# Patient Record
Sex: Female | Born: 1950 | Race: White | Hispanic: No | Marital: Married | Smoking: Former smoker
Health system: Southern US, Community
[De-identification: ages and names within clinical notes are randomized; demographics above are authoritative.]

## PROBLEM LIST (undated history)

## (undated) DIAGNOSIS — F419 Anxiety disorder, unspecified: Secondary | ICD-10-CM

## (undated) DIAGNOSIS — F32A Depression, unspecified: Secondary | ICD-10-CM

## (undated) DIAGNOSIS — E079 Disorder of thyroid, unspecified: Secondary | ICD-10-CM

## (undated) DIAGNOSIS — M1990 Unspecified osteoarthritis, unspecified site: Secondary | ICD-10-CM

## (undated) DIAGNOSIS — T7840XA Allergy, unspecified, initial encounter: Secondary | ICD-10-CM

## (undated) DIAGNOSIS — D649 Anemia, unspecified: Secondary | ICD-10-CM

## (undated) DIAGNOSIS — I10 Essential (primary) hypertension: Secondary | ICD-10-CM

## (undated) HISTORY — DX: Anemia, unspecified: D64.9

## (undated) HISTORY — DX: Allergy, unspecified, initial encounter: T78.40XA

## (undated) HISTORY — PX: APPENDECTOMY: SHX54

## (undated) HISTORY — DX: Anxiety disorder, unspecified: F41.9

## (undated) HISTORY — PX: LUMBAR LAMINECTOMY: SHX95

## (undated) HISTORY — DX: Disorder of thyroid, unspecified: E07.9

## (undated) HISTORY — DX: Unspecified osteoarthritis, unspecified site: M19.90

## (undated) HISTORY — DX: Essential (primary) hypertension: I10

## (undated) HISTORY — PX: ABDOMINAL HYSTERECTOMY: SHX81

## (undated) HISTORY — DX: Depression, unspecified: F32.A

---

## 2014-01-03 DIAGNOSIS — M2010 Hallux valgus (acquired), unspecified foot: Secondary | ICD-10-CM | POA: Insufficient documentation

## 2014-03-18 DIAGNOSIS — Z4889 Encounter for other specified surgical aftercare: Secondary | ICD-10-CM | POA: Insufficient documentation

## 2014-04-20 DIAGNOSIS — M5414 Radiculopathy, thoracic region: Secondary | ICD-10-CM | POA: Insufficient documentation

## 2015-02-15 DIAGNOSIS — M5127 Other intervertebral disc displacement, lumbosacral region: Secondary | ICD-10-CM | POA: Insufficient documentation

## 2018-01-08 LAB — COLOGUARD: Cologuard: NEGATIVE

## 2019-03-05 DIAGNOSIS — M545 Low back pain, unspecified: Secondary | ICD-10-CM | POA: Insufficient documentation

## 2019-03-05 DIAGNOSIS — G8929 Other chronic pain: Secondary | ICD-10-CM | POA: Insufficient documentation

## 2019-03-05 DIAGNOSIS — G4486 Cervicogenic headache: Secondary | ICD-10-CM

## 2019-03-05 DIAGNOSIS — S060X0A Concussion without loss of consciousness, initial encounter: Secondary | ICD-10-CM

## 2019-03-05 DIAGNOSIS — M479 Spondylosis, unspecified: Secondary | ICD-10-CM | POA: Insufficient documentation

## 2019-03-05 DIAGNOSIS — G44329 Chronic post-traumatic headache, not intractable: Secondary | ICD-10-CM | POA: Insufficient documentation

## 2019-03-05 HISTORY — DX: Cervicogenic headache: G44.86

## 2019-03-05 HISTORY — DX: Concussion without loss of consciousness, initial encounter: S06.0X0A

## 2019-03-30 DIAGNOSIS — Z840 Family history of diseases of the skin and subcutaneous tissue: Secondary | ICD-10-CM | POA: Insufficient documentation

## 2019-03-30 DIAGNOSIS — G479 Sleep disorder, unspecified: Secondary | ICD-10-CM | POA: Insufficient documentation

## 2019-03-30 DIAGNOSIS — M2550 Pain in unspecified joint: Secondary | ICD-10-CM | POA: Insufficient documentation

## 2019-03-30 DIAGNOSIS — M1990 Unspecified osteoarthritis, unspecified site: Secondary | ICD-10-CM | POA: Insufficient documentation

## 2019-05-31 DIAGNOSIS — H519 Unspecified disorder of binocular movement: Secondary | ICD-10-CM | POA: Insufficient documentation

## 2020-05-30 ENCOUNTER — Telehealth: Payer: Self-pay | Admitting: Physical Medicine and Rehabilitation

## 2020-05-30 NOTE — Telephone Encounter (Signed)
Pt would like to schedule a new pt appt   [phone]

## 2020-05-31 ENCOUNTER — Telehealth: Payer: Self-pay | Admitting: Physical Medicine and Rehabilitation

## 2020-05-31 NOTE — Telephone Encounter (Signed)
Called pt and LVM #1 

## 2020-05-31 NOTE — Telephone Encounter (Signed)
Pt returned call for new appt.

## 2020-06-01 NOTE — Telephone Encounter (Signed)
See previous message

## 2020-06-01 NOTE — Telephone Encounter (Signed)
Scheduled for new patient OV. 

## 2020-06-14 ENCOUNTER — Other Ambulatory Visit: Payer: Self-pay

## 2020-06-14 ENCOUNTER — Ambulatory Visit (INDEPENDENT_AMBULATORY_CARE_PROVIDER_SITE_OTHER): Payer: 59 | Admitting: Physical Medicine and Rehabilitation

## 2020-06-14 ENCOUNTER — Encounter: Payer: Self-pay | Admitting: Physical Medicine and Rehabilitation

## 2020-06-14 VITALS — BP 128/82 | HR 67

## 2020-06-14 DIAGNOSIS — S76012S Strain of muscle, fascia and tendon of left hip, sequela: Secondary | ICD-10-CM

## 2020-06-14 DIAGNOSIS — M5416 Radiculopathy, lumbar region: Secondary | ICD-10-CM | POA: Diagnosis not present

## 2020-06-14 DIAGNOSIS — M961 Postlaminectomy syndrome, not elsewhere classified: Secondary | ICD-10-CM

## 2020-06-14 DIAGNOSIS — M25552 Pain in left hip: Secondary | ICD-10-CM

## 2020-06-14 DIAGNOSIS — M5136 Other intervertebral disc degeneration, lumbar region: Secondary | ICD-10-CM
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
## 2020-06-18 ENCOUNTER — Encounter: Payer: Self-pay | Admitting: Physical Medicine and Rehabilitation
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
## 2020-06-20 ENCOUNTER — Ambulatory Visit: Payer: Self-pay

## 2020-06-20 ENCOUNTER — Other Ambulatory Visit: Payer: Self-pay

## 2020-06-20 ENCOUNTER — Ambulatory Visit: Payer: 59 | Admitting: Family Medicine

## 2020-06-20 DIAGNOSIS — M25552 Pain in left hip: Secondary | ICD-10-CM

## 2020-06-20 MED ORDER — TRAMADOL HCL 50 MG PO TABS: 50.0000 mg | ORAL_TABLET | Freq: Four times a day (QID) | ORAL | 0 refills | Status: DC | PRN
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
## 2020-06-28 ENCOUNTER — Ambulatory Visit
Admission: RE | Admit: 2020-06-28 | Discharge: 2020-06-28 | Disposition: A | Discharge status: Home / Self Care | Payer: 59 | Source: Ambulatory Visit | Attending: Physical Medicine and Rehabilitation | Admitting: Physical Medicine and Rehabilitation

## 2020-06-28 ENCOUNTER — Other Ambulatory Visit: Payer: Self-pay

## 2020-06-30 ENCOUNTER — Telehealth: Payer: Self-pay | Admitting: Physical Medicine and Rehabilitation

## 2020-06-30 NOTE — Telephone Encounter (Signed)
Left message #1 to schedule OV for MRI review. 

## 2020-06-30 NOTE — Telephone Encounter (Signed)
Patient returned call to [facility] to set MRI appt. Please call pt at [phone].

## 2020-06-30 NOTE — Telephone Encounter (Signed)
-----   Message from Tyrell Antonio, MD sent at 06/29/2020 11:34 AM EST ----- Regarding: MRI Can OV to review, she is seeing [facility] for prolo of her tendon. MRI does show changes that could also give back and hip pain

## 2020-07-03 ENCOUNTER — Other Ambulatory Visit: Payer: Self-pay | Admitting: Family Medicine

## 2020-07-03 ENCOUNTER — Telehealth: Payer: Self-pay | Admitting: Physical Medicine and Rehabilitation

## 2020-07-03 NOTE — Telephone Encounter (Signed)
See previous message

## 2020-07-03 NOTE — Telephone Encounter (Signed)
Scheduled for OV 3/8 at 1000.

## 2020-07-03 NOTE — Telephone Encounter (Signed)
Pt called to go over MRI Results. CB [phone]

## 2020-07-04 ENCOUNTER — Ambulatory Visit: Payer: 59 | Admitting: Family Medicine

## 2020-07-04 ENCOUNTER — Other Ambulatory Visit: Payer: Self-pay

## 2020-07-04 ENCOUNTER — Ambulatory Visit: Payer: Self-pay

## 2020-07-04 ENCOUNTER — Encounter: Payer: Self-pay | Admitting: Family Medicine

## 2020-07-04 DIAGNOSIS — M25552 Pain in left hip: Secondary | ICD-10-CM | POA: Diagnosis not present

## 2020-07-04 NOTE — Progress Notes (Signed)
   Office Visit Note   [name]           Date of Birth: 05-Jun-1950           MRN: [id number] Visit Date: 07/04/2020 Requested by: No referring provider defined for this encounter. PCP: Patient, No Pcp Per  Subjective: Chief Complaint  Patient presents with  . Left Hip - Follow-up    She states that it is really doing bad.      HPI: She is here for planned dextrose prolotherapy for left hip greater trochanter syndrome.  This will be #2.  Temporary relief after the first injection but pain is just as bad as before.  She asked me to image the right hip because it bothers her in the same area.              ROS:   All other systems were reviewed and are negative.  Objective: Vital Signs: There were no vitals taken for this visit.  Physical Exam:  General:  Alert and oriented, in no acute distress. Pulm:  Breathing unlabored. Psy:  Normal mood, congruent affect.  Right hip: Point tender at the greater trochanter. Left hip: Also point tender at the greater trochanter.   Imaging: US Guided Needle Placement - No Linked Charges  Result Date: 07/04/2020 Ultrasound guided injection is preferred based studies that show increased duration, increased effect, greater accuracy, decreased procedural pain, increased response rate, and decreased cost with ultrasound guided versus blind injection.   Verbal informed consent obtained.  Time-out conducted.  Noted no overlying erythema, induration, or other signs of local infection. Ultrasound-guided left hip injection: After sterile prep with Betadine, injected 6 cc 0.25% bupivacaine without epinephrine and 4 cc 50% dextrose using a 22-gauge spinal needle, passing the needle multiple times through the gluteus medius tendon at the greater trochanter.  There appears to be a soft tissue calcification deep to the tendon in the area of her pain.  Limited diagnostic ultrasound of the right hip shows similar findings of soft tissue calcification deep  to the gluteus medius tendon.   Assessment & Plan: 1.  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 -Injection repeated today.  We will do another in 2 to 3 weeks.  At that point we will reassess and determine whether to continue doing these.     Procedures: No procedures performed        PMFS History: There are no problems to display for this patient.  History reviewed. No pertinent past medical history.  History reviewed. No pertinent family history.  History reviewed. No pertinent surgical history. Social History   Occupational History  . Not on file  Tobacco Use  . Smoking status: Former Games developer  . Smokeless tobacco: Never Used  Substance and Sexual Activity  . Alcohol use: Not on file  . Drug use: Not on file  . Sexual activity: Not on file

## 2020-07-11 ENCOUNTER — Ambulatory Visit: Payer: 59 | Admitting: Physical Medicine and Rehabilitation

## 2020-07-11 ENCOUNTER — Encounter: Payer: Self-pay | Admitting: Physical Medicine and Rehabilitation

## 2020-07-11 ENCOUNTER — Other Ambulatory Visit: Payer: Self-pay

## 2020-07-11 VITALS — BP 130/89 | HR 103

## 2020-07-11 DIAGNOSIS — R202 Paresthesia of skin: Secondary | ICD-10-CM | POA: Diagnosis not present

## 2020-07-11 DIAGNOSIS — M5442 Lumbago with sciatica, left side: Secondary | ICD-10-CM

## 2020-07-11 DIAGNOSIS — G894 Chronic pain syndrome: Secondary | ICD-10-CM

## 2020-07-11 DIAGNOSIS — M48061 Spinal stenosis, lumbar region without neurogenic claudication: Secondary | ICD-10-CM

## 2020-07-11 DIAGNOSIS — M25552 Pain in left hip: Secondary | ICD-10-CM | POA: Diagnosis not present

## 2020-07-11 DIAGNOSIS — M5441 Lumbago with sciatica, right side: Secondary | ICD-10-CM

## 2020-07-11 DIAGNOSIS — G8929 Other chronic pain: Secondary | ICD-10-CM

## 2020-07-11 DIAGNOSIS — S76012S Strain of muscle, fascia and tendon of left hip, sequela: Secondary | ICD-10-CM

## 2020-07-11 DIAGNOSIS — M961 Postlaminectomy syndrome, not elsewhere classified: Secondary | ICD-10-CM

## 2020-07-11 NOTE — Progress Notes (Signed)
Here for MRI review. Back has "been pretty good." Taking pain medication for her hip pain. Has seen Dr. Prince Rome and is doing better. Having pain in multiple joints.  Numeric Pain Rating Scale and Functional Assessment Average Pain 7   In the last MONTH (on 0-10 scale) has pain interfered with the following?  1. General activity like being  able to carry out your everyday physical activities such as walking, climbing stairs, carrying groceries, or moving a chair?  Rating(10)

## 2020-07-11 NOTE — Progress Notes (Signed)
Sharon Anthony - 70 y.o. female MRN [id number]  Date of birth: 29-Nov-1950  Office Visit Note: Visit Date: 07/11/2020 PCP: Patient, No Pcp Per Referred by: No ref. provider found  Subjective: Chief Complaint  Patient presents with  . Lower Back - Pain   HPI: Sharon Anthony is a 70 y.o. female who comes in today For follow-up evaluation and management of chronic history of back pain with radicular pain in the leg status post multiple lumbar surgery.  Since have seen her she has obtain MRI of the lumbar spine this is reviewed with the patient today and reviewed below in the notes.  We did do spine models and imaging.  She has a grade 1 listhesis of L4 on L5 with lateral recess and by foraminal narrowing at that level and at L5.  She has no high-grade central stenosis.  No high-grade herniated disc or other nerve compression.  Degenerative facet arthropathy of the lower spine.  She reports multiple complaints today including continued left hip pain over the greater trochanter and gluteus medius.  She has a known gluteus medius tear.  She is seeing Dr. Lavada Mesi in our office for prolotherapy.  She reports first injection helped some second injection has helped more.  She reports during the process of doing the prolotherapy he has had her stop diclofenac which she has been taking regularly twice a day.  She now has multiple joint arthritic pains.  She has pains in the hands and knees and back.  She also endorses paresthesias into the feet bilaterally.  Her history before seeing [place] can be reviewed but basically she had had a accident where she did have a head injury with concussion and chronic back and hip pain since that time.  She reports her primary care physician back in [place] has suggested that sometimes trauma like this can, get everything inflamed and flared up.  She reports also that the primary care physician felt like she may have some type of polyneuropathy and had suggested seeing a  neurologist for evaluation.  She does have a history of a brother with psoriasis but no history of psoriatic arthritis or rheumatoid arthritis.  She does have some drug intolerances but no specific history of fibromyalgia.  She did see a neurologist in [place] but this was more for the concussion.  She has been followed with a spine physiatrist in [place] with several injections of the lumbar spine particular at L5 level which did help initially but then more recently she was found to have this gluteus medius tear.  She has not had any electrodiagnostic studies.  She denies any focal weakness foot drop or bowel bladder changes.  Review of Systems  Musculoskeletal: Positive for back pain and joint pain.  Neurological: Positive for tingling.  All other systems reviewed and are negative.  Otherwise per HPI.  Assessment & Plan: Visit Diagnoses:    ICD-10-CM   1. Chronic bilateral low back pain with bilateral sciatica  M54.42    M54.41    G89.29   2. Pain in left hip  M25.552   3. Tear of left gluteus medius tendon, sequela  S76.012S   4. Paresthesia of skin  R20.2 Ambulatory referral to Neurology  5. Foraminal stenosis of lumbar region  M48.061 Ambulatory referral to Neurology  6. Post laminectomy syndrome  M96.1 Ambulatory referral to Neurology  7. Chronic pain syndrome  G89.4      Plan: Findings:  1.  Low back pain chronic severe at  times worse with standing some referral into the hips also this left hip pain which may technically be gluteus medius tear.  For right now really nothing to do with her lumbar spine she is status quo at this point with no real flareup of symptoms.  She is undergoing prolotherapy for the left hip.  If that fails to give her much relief for [facility] declares itself to be more lumbar related we would look at probable transforaminal injection at L5 or S1.  Could consider spinal cord stimulator trial at some point did not talk about that today.  2.  Multiple  joint pain in the hands shoulders knees etc.  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  I feel like this is all flared up after she had to stop her diclofenac for the prolotherapy.  Have asked her to discuss this with Dr. Lavada Mesi to see if restarting that medication would be detrimental to the prolotherapy itself.  I do not think this is any new issue just worsening symptoms without having the anti-inflammatory medicine on board.  Would consider referral to rheumatology.  3.  Numbness and tingling in both feet could be an L5 radiculopathy but is more global.  Her primary care physician evidently was worried that she may have a polyneuropathy.  She is not diabetic.  Discussed at length with her referral to [facility] neurology particularly may be Dr. Lucia Gaskins or one of her colleagues.  This would be for evaluation management of the tingling in the legs.  If it became known to be more of a radicular symptom I will be glad to see her back for potential treatment of the spine.  If it did seem to be more polyneuropathy they may wish to look at electrodiagnostic studies diagnostically.  She takes a fairly decent dose of gabapentin but would like to be able to reduce the dose.    Meds & Orders: No orders of the defined types were placed in this encounter.   Orders Placed This Encounter  Procedures  . Ambulatory referral to Neurology    Follow-up: Return if symptoms worsen or fail to improve.   Procedures: No procedures performed      Clinical History: MRI LUMBAR SPINE WITHOUT CONTRAST  TECHNIQUE: Multiplanar, multisequence MR imaging of the lumbar spine was performed. No intravenous contrast was administered.  COMPARISON:  None.  FINDINGS: Segmentation:  Normal  Alignment:  Mild retrolisthesis L2-3.  7 mm anterolisthesis L4-5.  Vertebrae:  Negative for fracture or mass.  Conus medullaris and cauda equina: Conus extends to the L1-2 level. Conus and cauda equina  appear normal.  Paraspinal and other soft tissues: Negative for paraspinous mass or adenopathy.  Disc levels:  Disc degeneration and Schmorl's nodes at T10-11 and T11-T12 and T12-L1 without significant spinal stenosis  L1-2: Disc degeneration and disc bulging with Schmorl's node. Negative for stenosis  L2-3: Mild retrolisthesis. Mild disc bulging and mild facet degeneration. Mild subarticular stenosis bilaterally.  L3-4: Bilateral laminectomy. Spinal canal normal in size. Disc degeneration with disc bulging and bilateral facet hypertrophy. Moderate subarticular and foraminal stenosis bilaterally left greater than right  L4-5: Bilateral laminectomy with adequate decompression of spinal canal. 6 mm anterolisthesis. Moderately large central disc protrusion. Bilateral facet hypertrophy. Moderate to severe subarticular and foraminal stenosis on the right. Moderate subarticular stenosis on the left.  L5-S1: Disc degeneration with diffuse disc bulging and endplate spurring. Bilateral facet hypertrophy. Severe foraminal encroachment bilaterally due to spurring.  IMPRESSION: Bilateral laminectomy  L3-4. Endplate spurring and facet hypertrophy contribute to moderate subarticular and foraminal stenosis bilaterally left greater than right  Bilateral laminectomy L4-5 with 7 mm anterolisthesis. Moderate to severe subarticular and foraminal stenosis on the right and moderate subarticular stenosis on the left  Severe foraminal encroachment bilaterally L5-S1 due to spurring.   Electronically Signed   By: Marlan Palauharles  Clark M.D.   On: 06/28/2020 13:27 ---- CT LUMBAR SPINE WO IV CONTRAST HISTORY: L/S-spine fusion, follow up COMPARISON: None. TECHNIQUE: CT lumbar spine without contrast was performed. FINDINGS: VERTEBRAE: 5 nonrib-bearing lumbar type vertebral bodies present. No fracture. Grade 1 anterolisthesis L4 with respect to L5. Grade 1 retrolisthesis L5 with respect  to S1. DISC LEVELS: L3/L4 disc is decreased in height by 25%. Laminectomies at this level from previous surgery. There is a moderate disc bulge. Bilateral facet disease and hypertrophy. Moderate right and moderate to severe left neural foraminal stenosis. There is a mild disc bulge which has recurred. At L4/L5 disc shows vacuum disc phenomenon compatible severe degenerative change. The patient has had laminectomies at this level previously. Moderate recurrent diffuse disc bulge. Severe left and right neural foraminal stenosis. Bilateral severe facet disease. At L5/S1 there is severe degeneration of the disc with vacuum disc phenomenon. Patient has had laminectomies. At least moderate size recurrent disc bulge. Bilateral facet disease and hypertrophy. Severe bilateral neural foraminal stenosis. SOFT TISSUES: Unremarkable. MISCELLANEOUS: There is no significant abnormality noted otherwise. IMPRESSION: No sign of acute fractures. Postoperative change as above. Degenerative change as above. Signed By: Karie KirksPaul Licata, MD on 12/14/2018 5:46 PM   She reports that she has quit smoking. She has never used smokeless tobacco. No results for input(s): HGBA1C, LABURIC in the last 8760 hours.  Objective:  VS:  HT:    WT:   BMI:     BP:130/89  HR:(!) 103bpm  TEMP: ( )  RESP:  Physical Exam Vitals and nursing note reviewed.  Constitutional:      General: She is not in acute distress.    Appearance: Normal appearance. She is not ill-appearing.  HENT:     Head: Normocephalic and atraumatic.     Right Ear: External ear normal.     Left Ear: External ear normal.  Eyes:     Extraocular Movements: Extraocular movements intact.  Cardiovascular:     Rate and Rhythm: Normal rate.     Pulses: Normal pulses.  Pulmonary:     Effort: Pulmonary effort is normal. No respiratory distress.  Abdominal:     General: There is no distension.     Palpations: Abdomen is soft.     Tenderness: There is no  abdominal tenderness.  Musculoskeletal:        General: Tenderness present.     Cervical back: Neck supple. Tenderness present. No rigidity.     Right lower leg: No edema.     Left lower leg: No edema.     Comments: Patient has good distal strength with pain over the left greater trochanter more than right.  No pain with hip rotation.  She has pain with facet loading and extension of the lumbar spine.  She has good sensation to light touch some dysesthesia.  No swelling no allodynia.  No atrophy noted.  Skin:    Findings: No erythema, lesion or rash.  Neurological:     General: No focal deficit present.     Mental Status: She is alert and oriented to person, place, and time.     Sensory: No sensory  deficit.     Motor: No weakness or abnormal muscle tone.     Coordination: Coordination normal.  Psychiatric:        Mood and Affect: Mood normal.        Behavior: Behavior normal.     Ortho Exam  Imaging: No results found.  Past Medical/Family/Surgical/Social History: Medications & Allergies reviewed per EMR, new medications updated. Patient Active Problem List   Diagnosis Date Noted  . 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 Family history of psoriasis in brother 03/30/2019  . 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 Polyarthralgia 03/30/2019  . Sleep disorder 03/30/2019  . Chronic bilateral low back pain without sciatica 03/05/2019  . Chronic post-traumatic headache, not intractable 03/05/2019  . Degenerative joint disease of low back 03/05/2019  . 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 Encounter for other specified surgical aftercare 03/18/2014  . Acquired hallux valgus 01/03/2014   Past Medical History:  Diagnosis Date  . Cervicogenic headache 03/05/2019  . Concussion with no loss of consciousness 03/05/2019  . Hypertension    History reviewed. No pertinent family history. Past  Surgical History:  Procedure Laterality Date  . LUMBAR LAMINECTOMY     L3-4 and L4-5   Social History   Occupational History  . Not on file  Tobacco Use  . Smoking status: Former Games developer  . Smokeless tobacco: Never Used  Substance and Sexual Activity  . Alcohol use: Not on file  . Drug use: Not on file  . Sexual activity: Not on file

## 2020-07-12 ENCOUNTER — Ambulatory Visit: Payer: 59 | Admitting: Neurology

## 2020-07-12 ENCOUNTER — Encounter: Payer: Self-pay | Admitting: Neurology

## 2020-07-12 VITALS — BP 117/77 | HR 84 | Ht 68.5 in | Wt 192.0 lb

## 2020-07-12 DIAGNOSIS — M2550 Pain in unspecified joint: Secondary | ICD-10-CM | POA: Diagnosis not present

## 2020-07-12 DIAGNOSIS — R202 Paresthesia of skin: Secondary | ICD-10-CM

## 2020-07-12 DIAGNOSIS — R200 Anesthesia of skin: Secondary | ICD-10-CM | POA: Diagnosis not present

## 2020-07-12 DIAGNOSIS — R208 Other disturbances of skin sensation: Secondary | ICD-10-CM
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
## 2020-07-12 NOTE — Patient Instructions (Addendum)
Bradley and Daroff's neurology in clinical practice (8th ed., pp. 1853- 1929). Elsevier."> Goldman-Cecil medicine (26th ed., pp. 2489- 2501). Elsevier.">  Peripheral Neuropathy Peripheral neuropathy is a type of nerve damage. It affects nerves that carry signals between the spinal cord and the arms, legs, and the rest of the body (peripheral nerves). It does not affect nerves in the spinal cord or brain. In peripheral neuropathy, one nerve or a group of nerves may be damaged. Peripheral neuropathy is a broad category that includes many specific nerve disorders, like diabetic neuropathy, hereditary neuropathy, and carpal tunnel syndrome. What are the causes? This condition may be caused by:  Diabetes. This is the most common cause of peripheral neuropathy.  Nerve injury.  Pressure or stress on a nerve that lasts a long time.  Lack (deficiency) of B vitamins. This can result from alcoholism, poor diet, or a restricted diet.  Infections.  Autoimmune diseases, such as rheumatoid arthritis and systemic lupus erythematosus.  Nerve diseases that are passed from parent to child (inherited).  Some medicines, such as cancer medicines (chemotherapy).  Poisonous (toxic) substances, such as lead and mercury.  Too little blood flowing to the legs.  Kidney disease.  Thyroid disease. In some cases, the cause of this condition is not known. What are the signs or symptoms? Symptoms of this condition depend on which of your nerves is damaged. Common symptoms include:  Loss of feeling (numbness) in the feet, hands, or both.  Tingling in the feet, hands, or both.  Burning pain.  Very sensitive skin.  Weakness.  Not being able to move a part of the body (paralysis).  Muscle twitching.  Clumsiness or poor coordination.  Loss of balance.  Not being able to control your bladder.  Feeling dizzy.  Sexual problems. How is this diagnosed? Diagnosing and finding the cause of peripheral  neuropathy can be difficult. Your health care provider will take your medical history and do a physical exam. A neurological exam will also be done. This involves checking things that are affected by your brain, spinal cord, and nerves (nervous system). For example, your health care provider will check your reflexes, how you move, and what you can feel. You may have other tests, such as:  Blood tests.  Electromyogram (EMG) and nerve conduction tests. These tests check nerve function and how well the nerves are controlling the muscles.  Imaging tests, such as CT scans or MRI to rule out other causes of your symptoms.  Removing a small piece of nerve to be examined in a lab (nerve biopsy).  Removing and examining a small amount of the fluid that surrounds the brain and spinal cord (lumbar puncture). How is this treated? Treatment for this condition may involve:  Treating the underlying cause of the neuropathy, such as diabetes, kidney disease, or vitamin deficiencies.  Stopping medicines that can cause neuropathy, such as chemotherapy.  Medicine to help relieve pain. Medicines may include: ? Prescription or over-the-counter pain medicine. ? Antiseizure medicine. ? Antidepressants. ? Pain-relieving patches that are applied to painful areas of skin.  Surgery to relieve pressure on a nerve or to destroy a nerve that is causing pain.  Physical therapy to help improve movement and balance.  Devices to help you move around (assistive devices). Follow these instructions at home: Medicines  Take over-the-counter and prescription medicines only as told by your health care provider. Do not take any other medicines without first asking your health care provider.  Do not drive or use heavy   machinery while taking prescription pain medicine. Lifestyle  Do not use any products that contain nicotine or tobacco, such as cigarettes and e-cigarettes. Smoking keeps blood from reaching damaged nerves.  If you need help quitting, ask your health care provider.  Avoid or limit alcohol. Too much alcohol can cause a vitamin B deficiency, and vitamin B is needed for healthy nerves.  Eat a healthy diet. This includes: ? Eating foods that are high in fiber, such as fresh fruits and vegetables, whole grains, and beans. ? Limiting foods that are high in fat and processed sugars, such as fried or sweet foods.   General instructions  If you have diabetes, work closely with your health care provider to keep your blood sugar under control.  If you have numbness in your feet: ? Check every day for signs of injury or infection. Watch for redness, warmth, and swelling. ? Wear padded socks and comfortable shoes. These help protect your feet.  Develop a good support system. Living with peripheral neuropathy can be stressful. Consider talking with a mental health specialist or joining a support group.  Use assistive devices and attend physical therapy as told by your health care provider. This may include using a walker or a cane.  Keep all follow-up visits as told by your health care provider. This is important.   Contact a health care provider if:  You have new signs or symptoms of peripheral neuropathy.  You are struggling emotionally from dealing with peripheral neuropathy.  Your pain is not well-controlled. Get help right away if:  You have an injury or infection that is not healing normally.  You develop new weakness in an arm or leg.  You have fallen or do so frequently. Summary  Peripheral neuropathy is when the nerves in the arms, or legs are damaged, resulting in numbness, weakness, or pain.  There are many causes of peripheral neuropathy, including diabetes, pinched nerves, vitamin deficiencies, autoimmune disease, and hereditary conditions.  Diagnosing and finding the cause of peripheral neuropathy can be difficult. Your health care provider will take your medical history, do a  physical exam, and do tests, including blood tests and nerve function tests.  Treatment involves treating the underlying cause of the neuropathy and taking medicines to help control pain. Physical therapy and assistive devices may also help. This information is not intended to replace advice given to you by your health care provider. Make sure you discuss any questions you have with your health care provider. Document Revised: 02/01/2020 Document Reviewed: 02/01/2020 Elsevier Patient Education  2021 Elsevier Inc.  

## 2020-07-18 ENCOUNTER — Ambulatory Visit: Payer: 59 | Admitting: Family Medicine

## 2020-07-18 ENCOUNTER — Encounter: Payer: Self-pay | Admitting: Family Medicine

## 2020-07-18 ENCOUNTER — Other Ambulatory Visit: Payer: Self-pay

## 2020-07-18 ENCOUNTER — Ambulatory Visit: Payer: Self-pay

## 2020-07-18 ENCOUNTER — Telehealth: Payer: Self-pay | Admitting: Neurology

## 2020-07-18 DIAGNOSIS — M25552 Pain in left hip: Secondary | ICD-10-CM

## 2020-07-18 NOTE — Telephone Encounter (Signed)
Sharon Anthony's TSH was very abnormal, her levothyroxine medication may be too elevated. She may be having HYPERthyroid symptoms. What physician manages her medication? Have her read the mychart message I sent her with all the symptoms of hyperthyroidism. She may need to decrease her medication. Dr. Janora Norlander is happy to be her primary care (not sure she has one yet) and his recommendation was to drop it to 112 mcg daily and recheck labs in 2-3 months.

## 2020-07-18 NOTE — Telephone Encounter (Signed)
Spoke with patient. She is aware of the TSH abnormality. She stated she has an appt with her primary care via zoom tomorrow afternoon. Her primary care provider is Dr Nelda Bucks with [facility] out of [place], [place]. Pt stated that yesterday she called our office asking for the lab results to be faxed over to them. I told her I would follow-up. Their fax number is [phone].

## 2020-07-18 NOTE — Progress Notes (Signed)
Office Visit Note   [name]           Date of Birth: 04/12/1951           MRN: [id number] Visit Date: 07/18/2020 Requested by: No referring provider defined for this encounter. PCP: Patient, No Pcp Per  Subjective: Chief Complaint  Patient presents with  . Left Hip - Follow-up, Pain    Planned dextrose injection #3 greater trochanter. The 2nd injection lasted longer and the pain was not as intense when it came back.    HPI: She is here for dextrose prolotherapy #3 for left hip greater trochanter syndrome.  Last injection gave 1 week of pain-free relief, and when the pain returned it was not as intense as usual.  She is optimistic that this will help.  She is having increased pain in her hands due to not taking diclofenac by mouth, but has started to use Voltaren gel with some improvement.                ROS:   All other systems were reviewed and are negative.  Objective: Vital Signs: There were no vitals taken for this visit.  Physical Exam:  General:  Alert and oriented, in no acute distress. Pulm:  Breathing unlabored. Psy:  Normal mood, congruent affect. Skin: No erythema Left hip: Point tender over the greater trochanter.  Imaging: US Guided Needle Placement - No Linked Charges  Result Date: 07/18/2020 Ultrasound guided injection is preferred based studies that show increased duration, increased effect, greater accuracy, decreased procedural pain, increased response rate, and decreased cost with ultrasound guided versus blind injection.   Verbal informed consent obtained.  Time-out conducted.  Noted no overlying erythema, induration, or other signs of local infection. Ultrasound-guided left hip injection: After sterile prep with Betadine, injected 6 cc 1% lidocaine without epinephrine and 4 cc 50% dextrose using a 22-gauge spinal needle, passing the needle into the calcification deep to the gluteus medius tendon at the greater trochanter.       Assessment &  Plan: 1.  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  She will follow-up in a couple weeks for a fourth injection and then if he is doing well, we can do them as needed from that point forward.     Procedures: No procedures performed        PMFS History: Patient Active Problem List   Diagnosis Date Noted  . 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 Family history of psoriasis in brother 03/30/2019  . 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 Polyarthralgia 03/30/2019  . Sleep disorder 03/30/2019  . Chronic bilateral low back pain without sciatica 03/05/2019  . Chronic post-traumatic headache, not intractable 03/05/2019  . Degenerative joint disease of low back 03/05/2019  . 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 Encounter for other specified surgical aftercare 03/18/2014  . Acquired hallux valgus 01/03/2014   Past Medical History:  Diagnosis Date  . Cervicogenic headache 03/05/2019  . Concussion with no loss of consciousness 03/05/2019  . Hypertension     Family History  Problem Relation Age of Onset  . Transient ischemic attack Mother   . Dementia Mother   . Cancer Father   . Psoriasis Brother   . Arthritis Other   . High blood pressure Other        "everybody"  . Parkinson's disease Maternal Uncle   .  Neuropathy Neg Hx     Past Surgical History:  Procedure Laterality Date  . LUMBAR LAMINECTOMY     L3-4 and L4-5   Social History   Occupational History  . Not on file  Tobacco Use  . Smoking status: Former Smoker    Types: Cigarettes    Quit date: 1998    Years since quitting: 24.2  . Smokeless tobacco: Never Used  Vaping Use  . Vaping Use: Never used  Substance and Sexual Activity  . Alcohol use: Yes    Alcohol/week: 7.0 standard drinks    Types: 7 Standard drinks or equivalent per week    Comment: vodka,  sometimes wine   . Drug use: Never  . Sexual activity: Not on file

## 2020-07-19 NOTE — Telephone Encounter (Signed)
Done labs faxed on 07/17/20

## 2020-07-22 LAB — COMPREHENSIVE METABOLIC PANEL
ALT: 53 IU/L — ABNORMAL HIGH (ref 0–32)
AST: 45 IU/L — ABNORMAL HIGH (ref 0–40)
Albumin/Globulin Ratio: 1.9 (ref 1.2–2.2)
Albumin: 4.3 g/dL (ref 3.8–4.8)
Alkaline Phosphatase: 82 IU/L (ref 44–121)
BUN/Creatinine Ratio: 34 — ABNORMAL HIGH (ref 12–28)
BUN: 26 mg/dL (ref 8–27)
Bilirubin Total: 0.4 mg/dL (ref 0.0–1.2)
CO2: 23 mmol/L (ref 20–29)
Calcium: 9.3 mg/dL (ref 8.7–10.3)
Chloride: 105 mmol/L (ref 96–106)
Creatinine, Ser: 0.77 mg/dL (ref 0.57–1.00)
Globulin, Total: 2.3 g/dL (ref 1.5–4.5)
Glucose: 93 mg/dL (ref 65–99)
Potassium: 3.9 mmol/L (ref 3.5–5.2)
Sodium: 142 mmol/L (ref 134–144)
Total Protein: 6.6 g/dL (ref 6.0–8.5)
eGFR: 84 mL/min/{1.73_m2} (ref >59)

## 2020-07-22 LAB — VITAMIN B6: Vitamin B6: 15.4 ug/L (ref 2.0–32.8)

## 2020-07-22 LAB — SJOGREN'S SYNDROME ANTIBODS(SSA + SSB)
ENA SSA (RO) Ab: <0.2 AI (ref 0.0–0.9)
ENA SSB (LA) Ab: <0.2 AI (ref 0.0–0.9)

## 2020-07-22 LAB — CBC WITH DIFFERENTIAL/PLATELET
Basophils Absolute: 0 10*3/uL (ref 0.0–0.2)
Basos: 1 %
EOS (ABSOLUTE): 0.2 10*3/uL (ref 0.0–0.4)
Eos: 4 %
Hematocrit: 39.4 % (ref 34.0–46.6)
Hemoglobin: 13 g/dL (ref 11.1–15.9)
Immature Grans (Abs): 0 10*3/uL (ref 0.0–0.1)
Immature Granulocytes: 0 %
Lymphocytes Absolute: 1.5 10*3/uL (ref 0.7–3.1)
Lymphs: 30 %
MCH: 28.8 pg (ref 26.6–33.0)
MCHC: 33 g/dL (ref 31.5–35.7)
MCV: 87 fL (ref 79–97)
Monocytes Absolute: 0.9 10*3/uL (ref 0.1–0.9)
Monocytes: 18 %
Neutrophils Absolute: 2.4 10*3/uL (ref 1.4–7.0)
Neutrophils: 47 %
Platelets: 219 10*3/uL (ref 150–450)
RBC: 4.51 x10E6/uL (ref 3.77–5.28)
RDW: 12.1 % (ref 11.7–15.4)
WBC: 5 10*3/uL (ref 3.4–10.8)

## 2020-07-22 LAB — MULTIPLE MYELOMA PANEL, SERUM
Albumin SerPl Elph-Mcnc: 3.8 g/dL (ref 2.9–4.4)
Albumin/Glob SerPl: 1.4 (ref 0.7–1.7)
Alpha 1: 0.2 g/dL (ref 0.0–0.4)
Alpha2 Glob SerPl Elph-Mcnc: 0.7 g/dL (ref 0.4–1.0)
B-Globulin SerPl Elph-Mcnc: 1 g/dL (ref 0.7–1.3)
Gamma Glob SerPl Elph-Mcnc: 0.8 g/dL (ref 0.4–1.8)
Globulin, Total: 2.8 g/dL (ref 2.2–3.9)
IgA/Immunoglobulin A, Serum: 186 mg/dL (ref 87–352)
IgG (Immunoglobin G), Serum: 807 mg/dL (ref 586–1602)
IgM (Immunoglobulin M), Srm: 40 mg/dL (ref 26–217)

## 2020-07-22 LAB — SEDIMENTATION RATE: Sed Rate: 9 mm/hr (ref 0–40)

## 2020-07-22 LAB — VITAMIN B1: Thiamine: 111.8 nmol/L (ref 66.5–200.0)

## 2020-07-22 LAB — HEMOGLOBIN A1C
Est. average glucose Bld gHb Est-mCnc: 103 mg/dL
Hgb A1c MFr Bld: 5.2 % (ref 4.8–5.6)

## 2020-07-22 LAB — HEAVY METALS, BLOOD
Arsenic: 2 ug/L (ref 0–9)
Lead, Blood: 1 ug/dL (ref 0–4)
Mercury: 2.1 ug/L (ref 0.0–14.9)

## 2020-07-22 LAB — TSH: TSH: <0.005 u[IU]/mL — ABNORMAL LOW (ref 0.450–4.500)

## 2020-07-22 LAB — B12 AND FOLATE PANEL
Folate: 19.7 ng/mL (ref >3.0)
Vitamin B-12: 523 pg/mL (ref 232–1245)

## 2020-07-22 LAB — ANTINUCLEAR ANTIBODIES, IFA: ANA Titer 1: NEGATIVE

## 2020-07-22 LAB — VITAMIN D 25 HYDROXY (VIT D DEFICIENCY, FRACTURES): Vit D, 25-Hydroxy: 37.2 ng/mL (ref 30.0–100.0)

## 2020-07-22 LAB — RHEUMATOID FACTOR: Rheumatoid fact SerPl-aCnc: 11.1 IU/mL (ref <14.0)

## 2020-07-22 LAB — CYCLIC CITRUL PEPTIDE ANTIBODY, IGG/IGA: Cyclic Citrullin Peptide Ab: 7 units (ref 0–19)

## 2020-07-24 ENCOUNTER — Other Ambulatory Visit: Payer: Self-pay | Admitting: Family Medicine

## 2020-08-01 ENCOUNTER — Ambulatory Visit: Payer: 59 | Admitting: Family Medicine

## 2020-08-08 ENCOUNTER — Telehealth: Payer: Self-pay | Admitting: Physical Medicine and Rehabilitation

## 2020-08-08 ENCOUNTER — Other Ambulatory Visit: Payer: Self-pay | Admitting: Physical Medicine and Rehabilitation

## 2020-08-08 ENCOUNTER — Other Ambulatory Visit: Payer: Self-pay | Admitting: Family Medicine

## 2020-08-08 MED ORDER — GABAPENTIN 300 MG PO CAPS: 600.0000 mg | ORAL_CAPSULE | Freq: Three times a day (TID) | ORAL | 6 refills | Status: DC

## 2020-08-08 NOTE — Telephone Encounter (Signed)
Pt has been taking gabapentin from a different doctor and she states that they're not answering her calls so she is wondering if you could prescribe it to her? Cb [phone]

## 2020-08-08 NOTE — Telephone Encounter (Signed)
Ok by me. I sent in Rx for 300mg  2 caps TID, with refills

## 2020-08-08 NOTE — Progress Notes (Signed)
Gabapentin 300mg  2 caps TID

## 2020-08-08 NOTE — Telephone Encounter (Signed)
See message below. Patient states that she currently takes 2 300 mg gabapentin three times per day and sometimes wakes up during the night and takes an extra one. Please advise. [facility].

## 2020-08-08 NOTE — Telephone Encounter (Signed)
Called patient to advise that prescription was sent to her pharmacy. 

## 2020-08-10 ENCOUNTER — Other Ambulatory Visit: Payer: Self-pay | Admitting: Physical Medicine and Rehabilitation

## 2020-08-10 ENCOUNTER — Ambulatory Visit (INDEPENDENT_AMBULATORY_CARE_PROVIDER_SITE_OTHER): Payer: 59 | Admitting: Neurology

## 2020-08-10 ENCOUNTER — Encounter: Payer: Self-pay | Admitting: Neurology

## 2020-08-10 ENCOUNTER — Ambulatory Visit: Payer: 59 | Admitting: Neurology

## 2020-08-10 ENCOUNTER — Telehealth: Payer: Self-pay | Admitting: Neurology

## 2020-08-10 ENCOUNTER — Other Ambulatory Visit: Payer: Self-pay

## 2020-08-10 DIAGNOSIS — M5412 Radiculopathy, cervical region: Secondary | ICD-10-CM

## 2020-08-10 DIAGNOSIS — R200 Anesthesia of skin: Secondary | ICD-10-CM

## 2020-08-10 DIAGNOSIS — G8929 Other chronic pain: Secondary | ICD-10-CM

## 2020-08-10 DIAGNOSIS — G992 Myelopathy in diseases classified elsewhere: Secondary | ICD-10-CM

## 2020-08-10 DIAGNOSIS — R202 Paresthesia of skin: Secondary | ICD-10-CM

## 2020-08-10 DIAGNOSIS — R208 Other disturbances of skin sensation: Secondary | ICD-10-CM

## 2020-08-10 DIAGNOSIS — M542 Cervicalgia: Secondary | ICD-10-CM

## 2020-08-10 DIAGNOSIS — M2550 Pain in unspecified joint: Secondary | ICD-10-CM

## 2020-08-10 DIAGNOSIS — Z0289 Encounter for other administrative examinations: Secondary | ICD-10-CM

## 2020-08-10 DIAGNOSIS — M79601 Pain in right arm: Secondary | ICD-10-CM

## 2020-08-10 DIAGNOSIS — M79602 Pain in left arm: Secondary | ICD-10-CM

## 2020-08-10 DIAGNOSIS — M48061 Spinal stenosis, lumbar region without neurogenic claudication: Secondary | ICD-10-CM

## 2020-08-10 DIAGNOSIS — M961 Postlaminectomy syndrome, not elsewhere classified: Secondary | ICD-10-CM

## 2020-08-10 DIAGNOSIS — M5416 Radiculopathy, lumbar region: Secondary | ICD-10-CM

## 2020-08-10 DIAGNOSIS — R29898 Other symptoms and signs involving the musculoskeletal system: Secondary | ICD-10-CM

## 2020-08-10 NOTE — Telephone Encounter (Signed)
no to the covid questions MR Cervical spine wo contrast Dr. Lucia Gaskins [facility]: [id number] (exp. 08/10/20 to 09/24/20). Patient is scheduled at [facility] for 08/16/20.

## 2020-08-10 NOTE — Progress Notes (Signed)
See procedure note.

## 2020-08-10 NOTE — Progress Notes (Signed)
Full Name: Sharon Anthony Gender: Female MRN #: [id number] Date of Birth: 1950/05/29    Visit Date: 08/10/2020 09:19 Age: 70 Years Examining Physician: Naomie Dean, MD  Requesting Provider: Tyrell Antonio, MD    History: Tingling in the legs and feet. Also pain in hands and feet. Low back pain with pain radiating down the left leg to below the knee; radicular symptoms left leg. Right leg with paresthesias distally.   Summary: NCS performed on the bilateral lower extremities and left upper extremity.  The right peroneal motor nerve showed reduced amplitude (0.9 mV, normal greater than 2) and decreased conduction velocity (35 m/s, normal greater than 44).  The left sural sensory nerve showed reduced amplitude (3 V, normal greater than 6).  The right sural sensory nerve showed reduced amplitude (2 V, normal greater than 6).  The left superficial peroneal sensory nerve showed no response.  The right superficial peroneal sensory nerve showed reduced amplitude (2 V, normal greater than 6).  The left tibial F wave showed delayed latency (59.9 ms, normal less than 56).  The right tibial F wave showed delayed latency (60.4 ms, normal less than 56).  All remaining nerves (as indicated in the following tables) were within normal limits.   EMG needle exam performed on the left upper and lower extremities. The left tibialis anterior muscle showed spontaneous activity (2+ positive sharp waves), increased motor unit amplitude, diminished motor unit recruitment.  The left extensor hallucis longus showed reduced motor unit recruitment. All remaining muscles (as indicated in the following tables) were within normal limits.     Conclusion:  1. There is acute/ongoing denervation and chronic neurogenic changes in left-sided muscles that share L5 innervation consistent with radiculopathy.  2. 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  3. Mild distal axonal polyneuropathy. 4. No findings in the  upper extremities to explain symptoms, recommend MRI cervical spine.  Naomie Dean [facility] [address] [place], [place] [address] Tel: [phone] Fax: [phone]  Verbal informed consent was obtained from the patient, patient was informed of potential risk of procedure, including bruising, bleeding, hematoma formation, infection, muscle weakness, muscle pain, numbness, among others.        MNC    Nerve / Sites Muscle Latency Ref. Amplitude Ref. Rel Amp Segments Distance Velocity Ref. Area    ms ms mV mV %  cm m/s m/s mVms  L Median - APB     Wrist APB 3.6 ?4.4 5.8 ?4.0 100 Wrist - APB 0   18.3     Upper arm APB 7.7  5.1  89.3 Upper arm - Wrist 21 51 ?49 16.0  L Ulnar - ADM     Wrist ADM 2.6 ?3.3 7.9 ?6.0 100 Wrist - ADM 7   24.7     B.Elbow ADM 5.7  7.3  92.3 B.Elbow - Wrist 20 64 ?49 23.9     A.Elbow ADM 7.4  7.3  100 A.Elbow - B.Elbow 10 61 ?49 24.1  L Peroneal - EDB     Ankle EDB 3.1 ?6.5 2.0 ?2.0 100 Ankle - EDB 9   8.0     Fib head EDB 9.6  1.7  84.6 Fib head - Ankle 29 44 ?44 7.6     Pop fossa EDB 11.9  1.7  98.4 Pop fossa - Fib head 10 44 ?44 7.3         Pop fossa - Ankle  R Peroneal - EDB     Ankle EDB 4.7 ?6.5 0.9 ?2.0 100 Ankle - EDB 9   4.5     Fib head EDB 12.9  0.6  63.4 Fib head - Ankle 29 35 ?44 3.5     Pop fossa EDB 15.8  0.6  96 Pop fossa - Fib head 10 35 ?44 3.3         Pop fossa - Ankle      L Tibial - AH     Ankle AH 2.5 ?5.8 4.9 ?4.0 100 Ankle - AH 9   12.0     Pop fossa AH 11.1  3.8  77.1 Pop fossa - Ankle 37 43 ?41 11.7  R Tibial - AH     Ankle AH 3.1 ?5.8 5.9 ?4.0 100 Ankle - AH 9   17.4     Pop fossa AH 12.4  4.8  81.2 Pop fossa - Ankle 39 42 ?41 14.2                    SNC    Nerve / Sites Rec. Site Peak Lat Ref.  Amp Ref. Segments Distance Peak Diff Ref.    ms ms V V  cm ms ms  L Sural - Ankle (Calf)     Calf Ankle 2.8 ?4.4 3 ?6 Calf - Ankle 14    R Sural - Ankle (Calf)     Calf Ankle 2.9 ?4.4 2 ?6  Calf - Ankle 14    L Superficial peroneal - Ankle     Lat leg Ankle NR ?4.4 NR ?6 Lat leg - Ankle 14    R Superficial peroneal - Ankle     Lat leg Ankle 4.1 ?4.4 2 ?6 Lat leg - Ankle 14    L Median, Ulnar - Transcarpal comparison     Median Palm Wrist 2.0 ?2.2 39 ?35 Median Palm - Wrist 8       Ulnar Palm Wrist 2.0 ?2.2 12 ?12 Ulnar Palm - Wrist 8          Median Palm - Ulnar Palm  0.0 ?0.4  L Median - Orthodromic (Dig II, Mid palm)     Dig II Wrist 2.9 ?3.4 14 ?10 Dig II - Wrist 13    L Ulnar - Orthodromic, (Dig V, Mid palm)     Dig V Wrist 2.7 ?3.1 6 ?5 Dig V - Wrist 57                     F  Wave    Nerve F Lat Ref.   ms ms  L Tibial - AH 59.9 ?56.0  L Ulnar - ADM 27.7 ?32.0  R Tibial - AH 60.4 ?56.0           EMG Summary Table    Spontaneous MUAP Recruitment  Muscle IA Fib PSW Fasc Other Amp Dur. Poly Pattern  L. Deltoid Normal None None None _______ Normal Normal Normal Normal  L. Triceps brachii Normal None None None _______ Normal Normal Normal Normal  L. Pronator teres Normal None None None _______ Normal Normal Normal Normal  L. Biceps brachii Normal None None None _______ Normal Normal Normal Normal  L. First dorsal interosseous Normal None None None _______ Normal Normal Normal Normal  L. Vastus medialis Normal None None None _______ Normal Normal Normal Normal  L. Tibialis anterior Normal None 2+ None _______ Increased Normal Normal Reduced  L. Gastrocnemius (Medial head) Normal  None None None _______ Normal Normal Normal Normal  L. Gluteus maximus Normal None None None _______ Normal Normal Normal Normal  L. Gluteus medius Normal None None None _______ Normal Normal Normal Normal  L. Biceps femoris (long head) Normal None None None _______ Normal Normal Normal Normal  L. Cervical paraspinals (low) Normal None None None _______ Normal Normal Normal Normal  L. Lumbar paraspinals (low) Normal None None None _______ Normal Normal Normal Normal  L. Extensor hallucis  longus Normal None None None _______ Normal Normal Normal Reduced      

## 2020-08-10 NOTE — Procedures (Signed)
Full Name: Sharon Anthony Gender: Female MRN #: [id number] Date of Birth: 1950/05/29    Visit Date: 08/10/2020 09:19 Age: 70 Years Examining Physician: Naomie Dean, MD  Requesting Provider: Tyrell Antonio, MD    History: Tingling in the legs and feet. Also pain in hands and feet. Low back pain with pain radiating down the left leg to below the knee; radicular symptoms left leg. Right leg with paresthesias distally.   Summary: NCS performed on the bilateral lower extremities and left upper extremity.  The right peroneal motor nerve showed reduced amplitude (0.9 mV, normal greater than 2) and decreased conduction velocity (35 m/s, normal greater than 44).  The left sural sensory nerve showed reduced amplitude (3 V, normal greater than 6).  The right sural sensory nerve showed reduced amplitude (2 V, normal greater than 6).  The left superficial peroneal sensory nerve showed no response.  The right superficial peroneal sensory nerve showed reduced amplitude (2 V, normal greater than 6).  The left tibial F wave showed delayed latency (59.9 ms, normal less than 56).  The right tibial F wave showed delayed latency (60.4 ms, normal less than 56).  All remaining nerves (as indicated in the following tables) were within normal limits.   EMG needle exam performed on the left upper and lower extremities. The left tibialis anterior muscle showed spontaneous activity (2+ positive sharp waves), increased motor unit amplitude, diminished motor unit recruitment.  The left extensor hallucis longus showed reduced motor unit recruitment. All remaining muscles (as indicated in the following tables) were within normal limits.     Conclusion:  1. There is acute/ongoing denervation and chronic neurogenic changes in left-sided muscles that share L5 innervation consistent with radiculopathy.  2. 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  3. Mild distal axonal polyneuropathy. 4. No findings in the  upper extremities to explain symptoms, recommend MRI cervical spine.  Naomie Dean [facility] [address] [place], [place] [address] Tel: [phone] Fax: [phone]  Verbal informed consent was obtained from the patient, patient was informed of potential risk of procedure, including bruising, bleeding, hematoma formation, infection, muscle weakness, muscle pain, numbness, among others.        MNC    Nerve / Sites Muscle Latency Ref. Amplitude Ref. Rel Amp Segments Distance Velocity Ref. Area    ms ms mV mV %  cm m/s m/s mVms  L Median - APB     Wrist APB 3.6 ?4.4 5.8 ?4.0 100 Wrist - APB 0   18.3     Upper arm APB 7.7  5.1  89.3 Upper arm - Wrist 21 51 ?49 16.0  L Ulnar - ADM     Wrist ADM 2.6 ?3.3 7.9 ?6.0 100 Wrist - ADM 7   24.7     B.Elbow ADM 5.7  7.3  92.3 B.Elbow - Wrist 20 64 ?49 23.9     A.Elbow ADM 7.4  7.3  100 A.Elbow - B.Elbow 10 61 ?49 24.1  L Peroneal - EDB     Ankle EDB 3.1 ?6.5 2.0 ?2.0 100 Ankle - EDB 9   8.0     Fib head EDB 9.6  1.7  84.6 Fib head - Ankle 29 44 ?44 7.6     Pop fossa EDB 11.9  1.7  98.4 Pop fossa - Fib head 10 44 ?44 7.3         Pop fossa - Ankle  R Peroneal - EDB     Ankle EDB 4.7 ?6.5 0.9 ?2.0 100 Ankle - EDB 9   4.5     Fib head EDB 12.9  0.6  63.4 Fib head - Ankle 29 35 ?44 3.5     Pop fossa EDB 15.8  0.6  96 Pop fossa - Fib head 10 35 ?44 3.3         Pop fossa - Ankle      L Tibial - AH     Ankle AH 2.5 ?5.8 4.9 ?4.0 100 Ankle - AH 9   12.0     Pop fossa AH 11.1  3.8  77.1 Pop fossa - Ankle 37 43 ?41 11.7  R Tibial - AH     Ankle AH 3.1 ?5.8 5.9 ?4.0 100 Ankle - AH 9   17.4     Pop fossa AH 12.4  4.8  81.2 Pop fossa - Ankle 39 42 ?41 14.2                    SNC    Nerve / Sites Rec. Site Peak Lat Ref.  Amp Ref. Segments Distance Peak Diff Ref.    ms ms V V  cm ms ms  L Sural - Ankle (Calf)     Calf Ankle 2.8 ?4.4 3 ?6 Calf - Ankle 14    R Sural - Ankle (Calf)     Calf Ankle 2.9 ?4.4 2 ?6  Calf - Ankle 14    L Superficial peroneal - Ankle     Lat leg Ankle NR ?4.4 NR ?6 Lat leg - Ankle 14    R Superficial peroneal - Ankle     Lat leg Ankle 4.1 ?4.4 2 ?6 Lat leg - Ankle 14    L Median, Ulnar - Transcarpal comparison     Median Palm Wrist 2.0 ?2.2 39 ?35 Median Palm - Wrist 8       Ulnar Palm Wrist 2.0 ?2.2 12 ?12 Ulnar Palm - Wrist 8          Median Palm - Ulnar Palm  0.0 ?0.4  L Median - Orthodromic (Dig II, Mid palm)     Dig II Wrist 2.9 ?3.4 14 ?10 Dig II - Wrist 13    L Ulnar - Orthodromic, (Dig V, Mid palm)     Dig V Wrist 2.7 ?3.1 6 ?5 Dig V - Wrist 57                     F  Wave    Nerve F Lat Ref.   ms ms  L Tibial - AH 59.9 ?56.0  L Ulnar - ADM 27.7 ?32.0  R Tibial - AH 60.4 ?56.0           EMG Summary Table    Spontaneous MUAP Recruitment  Muscle IA Fib PSW Fasc Other Amp Dur. Poly Pattern  L. Deltoid Normal None None None _______ Normal Normal Normal Normal  L. Triceps brachii Normal None None None _______ Normal Normal Normal Normal  L. Pronator teres Normal None None None _______ Normal Normal Normal Normal  L. Biceps brachii Normal None None None _______ Normal Normal Normal Normal  L. First dorsal interosseous Normal None None None _______ Normal Normal Normal Normal  L. Vastus medialis Normal None None None _______ Normal Normal Normal Normal  L. Tibialis anterior Normal None 2+ None _______ Increased Normal Normal Reduced  L. Gastrocnemius (Medial head) Normal  None None None _______ Normal Normal Normal Normal  L. Gluteus maximus Normal None None None _______ Normal Normal Normal Normal  L. Gluteus medius Normal None None None _______ Normal Normal Normal Normal  L. Biceps femoris (long head) Normal None None None _______ Normal Normal Normal Normal  L. Cervical paraspinals (low) Normal None None None _______ Normal Normal Normal Normal  L. Lumbar paraspinals (low) Normal None None None _______ Normal Normal Normal Normal  L. Extensor hallucis  longus Normal None None None _______ Normal Normal Normal Reduced

## 2020-08-16 ENCOUNTER — Other Ambulatory Visit: Payer: Self-pay

## 2020-08-16 ENCOUNTER — Ambulatory Visit: Payer: 59

## 2020-08-16 DIAGNOSIS — M542 Cervicalgia: Secondary | ICD-10-CM | POA: Diagnosis not present

## 2020-08-16 DIAGNOSIS — G992 Myelopathy in diseases classified elsewhere: Secondary | ICD-10-CM

## 2020-08-16 DIAGNOSIS — R29898 Other symptoms and signs involving the musculoskeletal system: Secondary | ICD-10-CM | POA: Diagnosis not present

## 2020-08-16 DIAGNOSIS — M5412 Radiculopathy, cervical region: Secondary | ICD-10-CM

## 2020-08-16 DIAGNOSIS — M79602 Pain in left arm: Secondary | ICD-10-CM

## 2020-08-16 DIAGNOSIS — R202 Paresthesia of skin: Secondary | ICD-10-CM

## 2020-08-16 DIAGNOSIS — M79601 Pain in right arm: Secondary | ICD-10-CM

## 2020-08-16 DIAGNOSIS — M4802 Spinal stenosis, cervical region: Secondary | ICD-10-CM

## 2020-08-16 DIAGNOSIS — R200 Anesthesia of skin: Secondary | ICD-10-CM

## 2020-08-16 DIAGNOSIS — G8929 Other chronic pain: Secondary | ICD-10-CM

## 2020-08-24 ENCOUNTER — Telehealth: Payer: Self-pay | Admitting: Family Medicine

## 2020-08-24 NOTE — Telephone Encounter (Signed)
I called and advised the patient that we are not sure when we will be able to get it for sure, it might be May or June. I assured her that she is on a list of patients to call to schedule appointments for dextrose injections, once we are able to get some. The list is kept on my desk.

## 2020-08-24 NOTE — Telephone Encounter (Signed)
Pt called stating she had an appt for 08/01/20 and it was canceled because we didn't have the injection here. Pt states she believes it was for pain and dextrose; she would like to know if our office received the injection yet? If not she would like to know how much longer for can be expected to wait.   [phone]

## 2020-08-25 ENCOUNTER — Other Ambulatory Visit: Payer: Self-pay | Admitting: Family Medicine

## 2020-09-20 ENCOUNTER — Telehealth: Payer: Self-pay | Admitting: Family Medicine

## 2020-09-20 MED ORDER — TRAMADOL HCL 50 MG PO TABS: 50.0000 mg | ORAL_TABLET | Freq: Four times a day (QID) | ORAL | 0 refills | Status: DC | PRN

## 2020-09-20 NOTE — Telephone Encounter (Signed)
I called and advised the patient. 

## 2020-09-20 NOTE — Telephone Encounter (Signed)
Patient called requesting a refill of tramadol. Patient is asking for [facility] to [facility] new [facility]. [place]. [facility]. Patient phone number is [phone].

## 2020-09-20 NOTE — Telephone Encounter (Signed)
Please advise 

## 2020-09-20 NOTE — Telephone Encounter (Signed)
Sent!

## 2020-10-11 ENCOUNTER — Telehealth: Payer: Self-pay | Admitting: *Deleted

## 2020-10-11 NOTE — Telephone Encounter (Signed)
Request faxed [facility] [phone] phone # [phone]

## 2020-10-31 ENCOUNTER — Other Ambulatory Visit: Payer: Self-pay | Admitting: Obstetrics

## 2020-10-31 DIAGNOSIS — Z13820 Encounter for screening for osteoporosis: Secondary | ICD-10-CM

## 2020-11-13 ENCOUNTER — Other Ambulatory Visit: Payer: Self-pay | Admitting: Family Medicine

## 2020-11-20 ENCOUNTER — Other Ambulatory Visit: Payer: Self-pay

## 2020-11-20 ENCOUNTER — Ambulatory Visit
Admission: RE | Admit: 2020-11-20 | Discharge: 2020-11-20 | Disposition: A | Discharge status: Home / Self Care | Payer: 59 | Source: Ambulatory Visit | Attending: Obstetrics | Admitting: Obstetrics

## 2020-11-20 DIAGNOSIS — Z13820 Encounter for screening for osteoporosis: Secondary | ICD-10-CM

## 2020-12-04 ENCOUNTER — Telehealth: Payer: Self-pay | Admitting: *Deleted

## 2020-12-04 NOTE — Telephone Encounter (Signed)
R/c medical records from [facility] notes on [place] desk.

## 2020-12-16 ENCOUNTER — Other Ambulatory Visit: Payer: Self-pay | Admitting: Family Medicine

## 2020-12-28 ENCOUNTER — Telehealth: Payer: Self-pay | Admitting: Neurology

## 2020-12-28 NOTE — Telephone Encounter (Signed)
I called patient.  She is was asking about the [place]/EMG results if showed peripheral neuropathy. Or if there is any other testing that would be done relating if PN?  Any other recommendations going forward as she is not any better.  Received notes from [facility] (had been speaking to [place] in Med records) and have received.  Still proceed with NS consult? As recommendation?   She has Zella Ball Comp case from [facility].

## 2020-12-28 NOTE — Telephone Encounter (Signed)
Pt called wanting to speak to the RN regarding the test that was ordered for her. Please advise.

## 2020-12-29 ENCOUNTER — Other Ambulatory Visit: Payer: Self-pay

## 2020-12-29 ENCOUNTER — Encounter: Payer: Self-pay | Admitting: Internal Medicine

## 2020-12-29 ENCOUNTER — Ambulatory Visit (INDEPENDENT_AMBULATORY_CARE_PROVIDER_SITE_OTHER): Payer: 59 | Admitting: Internal Medicine

## 2020-12-29 VITALS — BP 140/90 | HR 69 | Temp 98.4°F | Ht 67.0 in | Wt 189.3 lb

## 2020-12-29 DIAGNOSIS — M479 Spondylosis, unspecified: Secondary | ICD-10-CM

## 2020-12-29 DIAGNOSIS — E890 Postprocedural hypothyroidism: Secondary | ICD-10-CM

## 2020-12-29 DIAGNOSIS — M5414 Radiculopathy, thoracic region: Secondary | ICD-10-CM

## 2020-12-29 DIAGNOSIS — F339 Major depressive disorder, recurrent, unspecified: Secondary | ICD-10-CM

## 2020-12-29 DIAGNOSIS — E663 Overweight: Secondary | ICD-10-CM

## 2020-12-29 DIAGNOSIS — E782 Mixed hyperlipidemia: Secondary | ICD-10-CM

## 2020-12-29 DIAGNOSIS — B372 Candidiasis of skin and nail: Secondary | ICD-10-CM

## 2020-12-29 DIAGNOSIS — Z1211 Encounter for screening for malignant neoplasm of colon: Secondary | ICD-10-CM | POA: Diagnosis not present

## 2020-12-29 DIAGNOSIS — M1990 Unspecified osteoarthritis, unspecified site: Secondary | ICD-10-CM

## 2020-12-29 DIAGNOSIS — I10 Essential (primary) hypertension: Secondary | ICD-10-CM

## 2020-12-29 DIAGNOSIS — G479 Sleep disorder, unspecified: Secondary | ICD-10-CM

## 2020-12-29 DIAGNOSIS — M5127 Other intervertebral disc displacement, lumbosacral region: Secondary | ICD-10-CM

## 2020-12-29 DIAGNOSIS — M5417 Radiculopathy, lumbosacral region: Secondary | ICD-10-CM

## 2020-12-29 MED ORDER — NYSTATIN 100000 UNIT/GM EX POWD: 1.0000 "application " | Freq: Three times a day (TID) | CUTANEOUS | 0 refills | Status: DC
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
## 2021-01-02 ENCOUNTER — Other Ambulatory Visit: Payer: Self-pay | Admitting: Internal Medicine

## 2021-01-02 DIAGNOSIS — B372 Candidiasis of skin and nail: Secondary | ICD-10-CM

## 2021-01-02 NOTE — Telephone Encounter (Signed)
I called back and relayed the message to pt per Dr. Lucia Gaskins "   The emg did show a pinched nerve from the low back causing left leg symptoms; I would follow up with dr Alvester Morin who ordered the testing and is also a surgeon who can address the pinched nerve either with injections or surgery.  Pt verbalized understanding. Appreciated call back.

## 2021-01-10 ENCOUNTER — Telehealth: Payer: Self-pay | Admitting: Physical Medicine and Rehabilitation

## 2021-01-10 ENCOUNTER — Telehealth: Payer: Self-pay

## 2021-01-10 NOTE — Telephone Encounter (Signed)
Patient called. She would like Terri to call her. Has questions about the medication she was suppose to get in June but it wasn't available. Her call back number is [phone]

## 2021-01-10 NOTE — Telephone Encounter (Signed)
Pt called requesting a follow up appt with Dr. Alvester Morin. Pt phone number is [phone].

## 2021-01-10 NOTE — Telephone Encounter (Signed)
I called. She wanted to know if we have gotten any dextrose in --- it is still on back order. She is aware Dr. Prince Rome is no longer at this office. The patient plans to see Dr. Alvester Morin again and will ask him for suggestions.

## 2021-01-11 ENCOUNTER — Telehealth: Payer: Self-pay | Admitting: Physical Medicine and Rehabilitation

## 2021-01-11 NOTE — Telephone Encounter (Signed)
Left message #1 to schedule OV. 

## 2021-01-11 NOTE — Telephone Encounter (Signed)
Patient called. She would like an appointment with Dr. Alvester Morin. Her call back number is [phone]

## 2021-01-11 NOTE — Telephone Encounter (Signed)
Scheduled for OV. 

## 2021-01-11 NOTE — Telephone Encounter (Signed)
See previous message

## 2021-01-17 ENCOUNTER — Other Ambulatory Visit: Payer: Self-pay

## 2021-01-17 ENCOUNTER — Ambulatory Visit: Payer: 59 | Admitting: Physical Medicine and Rehabilitation

## 2021-01-17 ENCOUNTER — Encounter: Payer: Self-pay | Admitting: Physical Medicine and Rehabilitation

## 2021-01-17 VITALS — BP 155/81 | HR 72

## 2021-01-17 DIAGNOSIS — M5416 Radiculopathy, lumbar region: Secondary | ICD-10-CM

## 2021-01-17 DIAGNOSIS — M47816 Spondylosis without myelopathy or radiculopathy, lumbar region: Secondary | ICD-10-CM | POA: Diagnosis not present

## 2021-01-17 DIAGNOSIS — G5731 Lesion of lateral popliteal nerve, right lower limb: Secondary | ICD-10-CM

## 2021-01-17 DIAGNOSIS — M4802 Spinal stenosis, cervical region: Secondary | ICD-10-CM

## 2021-01-17 DIAGNOSIS — M4316 Spondylolisthesis, lumbar region: Secondary | ICD-10-CM

## 2021-01-17 DIAGNOSIS — M542 Cervicalgia: Secondary | ICD-10-CM

## 2021-01-17 DIAGNOSIS — M48061 Spinal stenosis, lumbar region without neurogenic claudication: Secondary | ICD-10-CM

## 2021-01-17 NOTE — Progress Notes (Signed)
Pt state lower back pain that travels down both legs to her toes. Pt state her toes are numb in the morning. Pt state she has pain in buttocks. Pt state she has shooting pain in both hands. Pt state sitting and laying down makes the pain worse. Pt state walking makes the pain worse. Pt state she wear back brace a lot. Pt state she takes pain meds and uses ice to help ease her pain.  Numeric Pain Rating Scale and Functional Assessment Average Pain 10 Pain Right Now 5 My pain is intermittent, sharp, stabbing, tingling, and aching Pain is worse with: walking, sitting, standing, and some activites Pain improves with: heat/ice and medication   In the last MONTH (on 0-10 scale) has pain interfered with the following?  1. General activity like being  able to carry out your everyday physical activities such as walking, climbing stairs, carrying groceries, or moving a chair?  Rating(8)  2. Relation with others like being able to carry out your usual social activities and roles such as  activities at home, at work and in your community. Rating(9)  3. Enjoyment of life such that you have  been bothered by emotional problems such as feeling anxious, depressed or irritable?  Rating(9)
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
## 2021-01-22 ENCOUNTER — Encounter: Payer: Self-pay | Admitting: Rehabilitative and Restorative Service Providers"

## 2021-01-22 ENCOUNTER — Other Ambulatory Visit: Payer: Self-pay

## 2021-01-22 ENCOUNTER — Ambulatory Visit: Payer: 59 | Admitting: Rehabilitative and Restorative Service Providers"

## 2021-01-22 DIAGNOSIS — R293 Abnormal posture: Secondary | ICD-10-CM

## 2021-01-22 DIAGNOSIS — M542 Cervicalgia: Secondary | ICD-10-CM | POA: Diagnosis not present

## 2021-01-22 DIAGNOSIS — M6281 Muscle weakness (generalized): Secondary | ICD-10-CM | POA: Diagnosis not present
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
## 2021-01-22 NOTE — Patient Instructions (Signed)
Access Code: [email or web address] URL: [email or web address] Date: 01/22/2021 Prepared by: Chyrel Masson  Exercises Seated Scapular Retraction - 2 x daily - 7 x weekly - 2 sets - 10 reps - 5 hold Seated Gentle Upper Trapezius Stretch (Mirrored) - 2 x daily - 7 x weekly - 1 sets - 5 reps - 15-20 hold Supine Deep Neck Flexor Training - 2 x daily - 7 x weekly - 1 sets - 10 reps - 5 hold Shoulder External Rotation and Scapular Retraction with Resistance - 2 x daily - 7 x weekly - 1 sets - 10 reps - 2-3 hold

## 2021-01-25 ENCOUNTER — Other Ambulatory Visit: Payer: Self-pay

## 2021-01-25 ENCOUNTER — Encounter: Payer: Self-pay | Admitting: Physical Medicine and Rehabilitation

## 2021-01-25 ENCOUNTER — Ambulatory Visit (INDEPENDENT_AMBULATORY_CARE_PROVIDER_SITE_OTHER): Payer: 59 | Admitting: Physical Medicine and Rehabilitation

## 2021-01-25 ENCOUNTER — Ambulatory Visit: Payer: Self-pay

## 2021-01-25 ENCOUNTER — Telehealth: Payer: Self-pay | Admitting: Internal Medicine

## 2021-01-25 VITALS — BP 132/78 | HR 81

## 2021-01-25 DIAGNOSIS — M5416 Radiculopathy, lumbar region: Secondary | ICD-10-CM | POA: Diagnosis not present

## 2021-01-25 DIAGNOSIS — Z1211 Encounter for screening for malignant neoplasm of colon: Secondary | ICD-10-CM

## 2021-01-25 MED ORDER — METHYLPREDNISOLONE ACETATE 80 MG/ML IJ SUSP
80.0000 mg | Freq: Once | INTRAMUSCULAR | Status: AC
Start: 2021-01-25 — End: 2021-01-25
  Administered 2021-01-25: 80 mg

## 2021-01-25 NOTE — Progress Notes (Signed)
Pt state lower back pain that travels down both legs to her toes. Pt state walking makes the pain worse. Pt state she takes pain meds and uses ice to help ease her pain.    Numeric Pain Rating Scale and Functional Assessment Average Pain 5   In the last MONTH (on 0-10 scale) has pain interfered with the following?  1. General activity like being  able to carry out your everyday physical activities such as walking, climbing stairs, carrying groceries, or moving a chair?  Rating(10)   +Driver, -BT, -Dye Allergies.

## 2021-01-25 NOTE — Patient Instructions (Signed)

## 2021-01-25 NOTE — Telephone Encounter (Signed)
Referral placed.

## 2021-01-25 NOTE — Telephone Encounter (Signed)
Patient wants to follow up on referral for colonoscopy.    Good callback number is [phone]     Please Advise

## 2021-01-25 NOTE — Progress Notes (Signed)
Sharon Anthony - 70 y.o. female MRN [id number]  Date of birth: January 22, 1951  Office Visit Note: Visit Date: 01/25/2021 PCP: Philip Aspen, Limmie Patricia, MD Referred by: Philip Aspen, Estel*  Subjective: Chief Complaint  Patient presents with   Lower Back - Pain   Left Leg - Pain   Right Leg - Pain   Right Foot - Pain   Left Foot - Pain   HPI:  Sharon Anthony is a 70 y.o. female who comes in today at the request of Ellin Goodie, FNP for planned Bilateral L5-S1 Lumbar Transforaminal epidural steroid injection with fluoroscopic guidance.  The patient has failed conservative care including home exercise, medications, time and activity modification.  This injection will be diagnostic and hopefully therapeutic.  Please see requesting physician notes for further details and justification.   ROS Otherwise per HPI.  Assessment & Plan: Visit Diagnoses:    ICD-10-CM   1. Lumbar radiculopathy  M54.16 XR C-ARM NO REPORT    Epidural Steroid injection    methylPREDNISolone acetate (DEPO-MEDROL) injection 80 mg      Plan: No additional findings.   Meds & Orders:  Meds ordered this encounter  Medications   methylPREDNISolone acetate (DEPO-MEDROL) injection 80 mg    Orders Placed This Encounter  Procedures   XR C-ARM NO REPORT   Epidural Steroid injection    Follow-up: No follow-ups on file.   Procedures: No procedures performed  Lumbosacral Transforaminal Epidural Steroid Injection - Sub-Pedicular Approach with Fluoroscopic Guidance  [name]      Date of Birth: 1950-11-09 MRN: [id number] PCP: Philip Aspen, Limmie Patricia, MD      Visit Date: 01/25/2021   Universal Protocol:    Date/Time: 01/25/2021  Consent Given By: the patient  Position: PRONE  Additional Comments: Vital signs were monitored before and after the procedure. Patient was prepped and draped in the usual sterile fashion. The correct patient, procedure, and site was verified.   Injection  Procedure Details:   Procedure diagnoses: Lumbar radiculopathy [M54.16]    Meds Administered:  Meds ordered this encounter  Medications   methylPREDNISolone acetate (DEPO-MEDROL) injection 80 mg    Laterality: Bilateral  Location/Site: L5  Needle:5.0 in., 22 ga.  Short bevel or Quincke spinal needle  Needle Placement: Transforaminal  Findings:    -Comments: Excellent flow of contrast along the nerve, nerve root and into the epidural space.  Procedure Details: After squaring off the end-plates to get a true AP view, the C-arm was positioned so that an oblique view of the foramen as noted above was visualized. The target area is just inferior to the "nose of the scotty dog" or sub pedicular. The soft tissues overlying this structure were infiltrated with 2-3 ml. of 1% Lidocaine without Epinephrine.  The spinal needle was inserted toward the target using a "trajectory" view along the fluoroscope beam.  Under AP and lateral visualization, the needle was advanced so it did not puncture dura and was located close the 6 O'Clock position of the pedical in AP tracterory. Biplanar projections were used to confirm position. Aspiration was confirmed to be negative for CSF and/or blood. A 1-2 ml. volume of Isovue-250 was injected and flow of contrast was noted at each level. Radiographs were obtained for documentation purposes.   After attaining the desired flow of contrast documented above, a 0.5 to 1.0 ml test dose of 0.25% Marcaine was injected into each respective transforaminal space.  The patient was observed for 90 seconds  post injection.  After no sensory deficits were reported, and normal lower extremity motor function was noted,   the above injectate was administered so that equal amounts of the injectate were placed at each foramen (level) into the transforaminal epidural space.   Additional Comments:  The patient tolerated the procedure well Dressing: 2 x 2 sterile gauze and Band-Aid     Post-procedure details: Patient was observed during the procedure. Post-procedure instructions were reviewed.  Patient left the clinic in stable condition.     Clinical History: MRI LUMBAR SPINE WITHOUT CONTRAST   TECHNIQUE: Multiplanar, multisequence MR imaging of the lumbar spine was performed. No intravenous contrast was administered.   COMPARISON:  None.   FINDINGS: Segmentation:  Normal   Alignment:  Mild retrolisthesis L2-3.  7 mm anterolisthesis L4-5.   Vertebrae:  Negative for fracture or mass.   Conus medullaris and cauda equina: Conus extends to the L1-2 level. Conus and cauda equina appear normal.   Paraspinal and other soft tissues: Negative for paraspinous mass or adenopathy.   Disc levels:   Disc degeneration and Schmorl's nodes at T10-11 and T11-T12 and T12-L1 without significant spinal stenosis   L1-2: Disc degeneration and disc bulging with Schmorl's node. Negative for stenosis   L2-3: Mild retrolisthesis. Mild disc bulging and mild facet degeneration. Mild subarticular stenosis bilaterally.   L3-4: Bilateral laminectomy. Spinal canal normal in size. Disc degeneration with disc bulging and bilateral facet hypertrophy. Moderate subarticular and foraminal stenosis bilaterally left greater than right   L4-5: Bilateral laminectomy with adequate decompression of spinal canal. 6 mm anterolisthesis. Moderately large central disc protrusion. Bilateral facet hypertrophy. Moderate to severe subarticular and foraminal stenosis on the right. Moderate subarticular stenosis on the left.   L5-S1: Disc degeneration with diffuse disc bulging and endplate spurring. Bilateral facet hypertrophy. Severe foraminal encroachment bilaterally due to spurring.   IMPRESSION: Bilateral laminectomy L3-4. Endplate spurring and facet hypertrophy contribute to moderate subarticular and foraminal stenosis bilaterally left greater than right   Bilateral laminectomy L4-5  with 7 mm anterolisthesis. Moderate to severe subarticular and foraminal stenosis on the right and moderate subarticular stenosis on the left   Severe foraminal encroachment bilaterally L5-S1 due to spurring.     Electronically Signed By: Marlan Palau M.D. On: 06/28/2020 13:27 ----  EXAM: MR CERVICAL SPINE WO CONTRAST  TECHNIQUE: MRI of the cervical spine was obtained utilizing 3 mm sagittal slices from the posterior fossa down to the T3-4 level with T1, T2 and inversion recovery views. In addition 4 mm axial slices from C2-3 down to T1-2 level were included with T2 and gradient echo views. CONTRAST: none  COMPARISON: none  IMAGING SITE: [facility] [address] (1.5 Tesla MRI)    FINDINGS:    On sagittal views the vertebral bodies have normal height and alignment.  Benign hemangioma at T1 vertebral body.  Mild degenerative spondylosis and disc bulging from C3-4 down to C6-7.  Schmorl's nodes and endplate disease noted at C5-6.  The spinal cord is normal in size and appearance. The posterior fossa, pituitary gland and paraspinal soft tissues are unremarkable.     On axial views: C2-3 no spinal stenosis or foraminal narrowing C3-4 uncovertebral joint hypertrophy with mild spinal stenosis and moderate bilateral foraminal stenosis C4-5 disc bulging and facet hypertrophy with mild spinal stenosis and moderate bilateral foraminal stenosis C5-6 uncovertebral joint hypertrophy with moderate left foraminal stenosis C6-7 disc bulging with borderline mild spinal stenosis and no foraminal narrowing C7-T1 uncovertebral joint hypertrophy  with moderate bilateral foraminal stenosis   Limited views of the soft tissues of the head and neck are unremarkable.     IMPRESSION:    MRI cervical spine without contrast imaging: - Mild multilevel spondylosis and disc bulging from C3-4 to C7-T1.  Multilevel foraminal stenosis as above.  - Borderline mild spinal stenosis noted at C3-4,  C4-5 and C6-7.  No cord signal abnormalities.         INTERPRETING PHYSICIAN:  Suanne Marker, MD Certified in Neurology, Neurophysiology and Neuroimaging     Objective:  VS:  HT:    WT:   BMI:     BP:132/78  HR:81bpm  TEMP: ( )  RESP:  Physical Exam Vitals and nursing note reviewed.  Constitutional:      General: She is not in acute distress.    Appearance: Normal appearance. She is not ill-appearing.  HENT:     Head: Normocephalic and atraumatic.     Right Ear: External ear normal.     Left Ear: External ear normal.  Eyes:     Extraocular Movements: Extraocular movements intact.  Cardiovascular:     Rate and Rhythm: Normal rate.     Pulses: Normal pulses.  Pulmonary:     Effort: Pulmonary effort is normal. No respiratory distress.  Abdominal:     General: There is no distension.     Palpations: Abdomen is soft.  Musculoskeletal:        General: Tenderness present.     Cervical back: Neck supple.     Right lower leg: No edema.     Left lower leg: No edema.     Comments: Patient has good distal strength with no pain over the greater trochanters.  No clonus or focal weakness.  Skin:    Findings: No erythema, lesion or rash.  Neurological:     General: No focal deficit present.     Mental Status: She is alert and oriented to person, place, and time.     Sensory: No sensory deficit.     Motor: No weakness or abnormal muscle tone.     Coordination: Coordination normal.  Psychiatric:        Mood and Affect: Mood normal.        Behavior: Behavior normal.     Imaging: No results found.

## 2021-01-31 ENCOUNTER — Encounter: Payer: Self-pay | Admitting: Rehabilitative and Restorative Service Providers"

## 2021-01-31 ENCOUNTER — Ambulatory Visit (INDEPENDENT_AMBULATORY_CARE_PROVIDER_SITE_OTHER): Payer: 59 | Admitting: Rehabilitative and Restorative Service Providers"

## 2021-01-31 ENCOUNTER — Other Ambulatory Visit: Payer: Self-pay

## 2021-01-31 DIAGNOSIS — M542 Cervicalgia: Secondary | ICD-10-CM

## 2021-01-31 DIAGNOSIS — M6281 Muscle weakness (generalized): Secondary | ICD-10-CM | POA: Diagnosis not present

## 2021-01-31 DIAGNOSIS — R293 Abnormal posture: Secondary | ICD-10-CM
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
## 2021-02-02 NOTE — Procedures (Signed)
Lumbosacral Transforaminal Epidural Steroid Injection - Sub-Pedicular Approach with Fluoroscopic Guidance  [name]      Date of Birth: 1951/01/19 MRN: [id number] PCP: Philip Aspen, Limmie Patricia, MD      Visit Date: 01/25/2021   Universal Protocol:    Date/Time: 01/25/2021  Consent Given By: the patient  Position: PRONE  Additional Comments: Vital signs were monitored before and after the procedure. Patient was prepped and draped in the usual sterile fashion. The correct patient, procedure, and site was verified.   Injection Procedure Details:   Procedure diagnoses: Lumbar radiculopathy [M54.16]    Meds Administered:  Meds ordered this encounter  Medications   methylPREDNISolone acetate (DEPO-MEDROL) injection 80 mg    Laterality: Bilateral  Location/Site: L5  Needle:5.0 in., 22 ga.  Short bevel or Quincke spinal needle  Needle Placement: Transforaminal  Findings:    -Comments: Excellent flow of contrast along the nerve, nerve root and into the epidural space.  Procedure Details: After squaring off the end-plates to get a true AP view, the C-arm was positioned so that an oblique view of the foramen as noted above was visualized. The target area is just inferior to the "nose of the scotty dog" or sub pedicular. The soft tissues overlying this structure were infiltrated with 2-3 ml. of 1% Lidocaine without Epinephrine.  The spinal needle was inserted toward the target using a "trajectory" view along the fluoroscope beam.  Under AP and lateral visualization, the needle was advanced so it did not puncture dura and was located close the 6 O'Clock position of the pedical in AP tracterory. Biplanar projections were used to confirm position. Aspiration was confirmed to be negative for CSF and/or blood. A 1-2 ml. volume of Isovue-250 was injected and flow of contrast was noted at each level. Radiographs were obtained for documentation purposes.   After attaining the  desired flow of contrast documented above, a 0.5 to 1.0 ml test dose of 0.25% Marcaine was injected into each respective transforaminal space.  The patient was observed for 90 seconds post injection.  After no sensory deficits were reported, and normal lower extremity motor function was noted,   the above injectate was administered so that equal amounts of the injectate were placed at each foramen (level) into the transforaminal epidural space.   Additional Comments:  The patient tolerated the procedure well Dressing: 2 x 2 sterile gauze and Band-Aid    Post-procedure details: Patient was observed during the procedure. Post-procedure instructions were reviewed.  Patient left the clinic in stable condition.

## 2021-02-12 ENCOUNTER — Ambulatory Visit: Payer: 59 | Admitting: Orthopedic Surgery

## 2021-02-12 ENCOUNTER — Ambulatory Visit (INDEPENDENT_AMBULATORY_CARE_PROVIDER_SITE_OTHER): Payer: 59 | Admitting: Rehabilitative and Restorative Service Providers"

## 2021-02-12 ENCOUNTER — Other Ambulatory Visit: Payer: Self-pay

## 2021-02-12 ENCOUNTER — Encounter: Payer: Self-pay | Admitting: Rehabilitative and Restorative Service Providers"

## 2021-02-12 DIAGNOSIS — R293 Abnormal posture: Secondary | ICD-10-CM | POA: Diagnosis not present

## 2021-02-12 DIAGNOSIS — M542 Cervicalgia: Secondary | ICD-10-CM | POA: Diagnosis not present

## 2021-02-12 DIAGNOSIS — M6281 Muscle weakness (generalized): Secondary | ICD-10-CM

## 2021-02-12 DIAGNOSIS — M25552 Pain in left hip: Secondary | ICD-10-CM
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
## 2021-02-13 ENCOUNTER — Other Ambulatory Visit: Payer: Self-pay

## 2021-02-13 NOTE — Telephone Encounter (Signed)
Levothyroxine, Losartan HCTZ and Bupropion were not prescribed by PCP. Are these medications ok to refill?

## 2021-02-13 NOTE — Telephone Encounter (Signed)
Left a message for the pt to return my call.  

## 2021-02-13 NOTE — Addendum Note (Signed)
Addended by: Christy Sartorius on: 02/13/2021 08:52 AM   Modules accepted: Orders

## 2021-02-13 NOTE — Telephone Encounter (Signed)
Patient called requesting Rx refill   30 day supply phentermine 30 MG capsule  90 day supply losartan-hydrochlorothiazide (HYZAAR) 50-12.5 MG tablet levothyroxine (SYNTHROID) 137 MCG tablet buPROPion (WELLBUTRIN XL) 150 MG 24 hr tablet

## 2021-02-14 NOTE — Telephone Encounter (Signed)
I left a detailed message informing the pt to contact our office to schedule visit in regards to rx.

## 2021-02-16 ENCOUNTER — Encounter: Payer: Self-pay | Admitting: Orthopedic Surgery
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
## 2021-02-18 ENCOUNTER — Other Ambulatory Visit: Payer: Self-pay

## 2021-02-18 ENCOUNTER — Emergency Department (HOSPITAL_BASED_OUTPATIENT_CLINIC_OR_DEPARTMENT_OTHER)
Admission: EM | Admit: 2021-02-18 | Discharge: 2021-02-18 | Disposition: A | Discharge status: Home / Self Care | Payer: 59 | Attending: Emergency Medicine | Admitting: Emergency Medicine

## 2021-02-18 ENCOUNTER — Encounter (HOSPITAL_BASED_OUTPATIENT_CLINIC_OR_DEPARTMENT_OTHER): Payer: Self-pay | Admitting: Emergency Medicine

## 2021-02-18 DIAGNOSIS — Z87891 Personal history of nicotine dependence: Secondary | ICD-10-CM | POA: Insufficient documentation

## 2021-02-18 DIAGNOSIS — H5711 Ocular pain, right eye: Secondary | ICD-10-CM | POA: Diagnosis present

## 2021-02-18 DIAGNOSIS — Z79899 Other long term (current) drug therapy: Secondary | ICD-10-CM | POA: Diagnosis not present

## 2021-02-18 DIAGNOSIS — I10 Essential (primary) hypertension: Secondary | ICD-10-CM | POA: Insufficient documentation

## 2021-02-18 DIAGNOSIS — Z91040 Latex allergy status: Secondary | ICD-10-CM | POA: Diagnosis not present

## 2021-02-18 DIAGNOSIS — H1131 Conjunctival hemorrhage, right eye: Secondary | ICD-10-CM | POA: Diagnosis not present

## 2021-02-18 MED ORDER — TETRACAINE HCL 0.5 % OP SOLN: 1.0000 [drp] | Freq: Once | OPHTHALMIC | Status: DC

## 2021-02-18 MED ORDER — ERYTHROMYCIN 5 MG/GM OP OINT
TOPICAL_OINTMENT | Freq: Once | OPHTHALMIC | Status: AC
  Administered 2021-02-18: 1 via OPHTHALMIC
  Filled 2021-02-18: qty 3.5

## 2021-02-18 MED ORDER — ERYTHROMYCIN 5 MG/GM OP OINT: TOPICAL_OINTMENT | OPHTHALMIC | 0 refills | Status: DC

## 2021-02-18 MED ORDER — FLUORESCEIN SODIUM 1 MG OP STRP
1.0000 | ORAL_STRIP | Freq: Once | OPHTHALMIC | Status: AC
  Administered 2021-02-18: 1 via OPHTHALMIC
  Filled 2021-02-18: qty 1

## 2021-02-18 MED ORDER — FLUORESCEIN SODIUM 1 MG OP STRP
ORAL_STRIP | OPHTHALMIC | Status: AC
  Filled 2021-02-18: qty 1

## 2021-02-18 MED ORDER — TETRACAINE HCL 0.5 % OP SOLN
OPHTHALMIC | Status: AC
  Filled 2021-02-18: qty 4

## 2021-02-18 MED ORDER — TETRACAINE HCL 0.5 % OP SOLN
2.0000 [drp] | Freq: Once | OPHTHALMIC | Status: AC
  Administered 2021-02-18: 2 [drp] via OPHTHALMIC

## 2021-02-18 MED ORDER — FLUORESCEIN SODIUM 1 MG OP STRP
1.0000 | ORAL_STRIP | Freq: Once | OPHTHALMIC | Status: AC
  Administered 2021-02-18: 1 via OPHTHALMIC

## 2021-02-18 NOTE — ED Triage Notes (Signed)
Patient felt a pop in her eye while cooking breakfast.

## 2021-02-18 NOTE — ED Provider Notes (Addendum)
MEDCENTER [facility] EMERGENCY DEPT Provider Note   CSN: [id number] Arrival date & time: 02/18/21  8088     History Chief Complaint  Patient presents with   Eye Pain    Sharon Anthony is a 70 y.o. female present emergency department with right eye pain and redness.  Patient reports that she was making breakfast this morning, she felt "a pop in my eye".  Afterwards she began having a looked like blood on the right side of the right eye (conjunctiva) and a sense of pressure in the eye.  She applied ice to it and came to the ED immediately.  She has never had this happen her before.  Currently she has mild discomfort.  The pain is worse with moving her eye around, particular with looking to the left.  She denies blurred vision or loss of vision.  She denies use of blood thinners.  She denies any direct trauma or injury to the eye.  She denies any history of ocular surgery.  She does wear glasses she is nearsighted, but does not wear contacts.  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     Family History  Problem Relation Age of Onset   Transient ischemic attack Mother    Dementia Mother    Cancer Father    Psoriasis Brother    Arthritis Other    High blood pressure Other        "everybody"   Parkinson's disease Maternal Uncle    Neuropathy Neg Hx     Social History   Tobacco Use   Smoking status: Former    Types: Cigarettes    Quit date: 1998    Years since quitting: 24.8   Smokeless tobacco: Never  Vaping Use   Vaping Use: Never used  Substance Use Topics   Alcohol use: Yes    Alcohol/week: 7.0 standard drinks    Types: 7 Standard drinks or equivalent per week    Comment: vodka, sometimes wine    Drug use: Never    Home Medications Prior to Admission medications   Medication Sig Start Date End Date Taking? Authorizing Provider  amLODipine (NORVASC) 5 MG tablet Take by mouth.   Yes [provider]  atorvastatin (LIPITOR) 20 MG tablet Take 20 mg by mouth daily. 05/24/20  Yes [provider]  buPROPion (WELLBUTRIN XL) 150 MG 24 hr tablet Take 150 mg by mouth every morning. 06/01/20  Yes [provider]  buPROPion (WELLBUTRIN XL)  300 MG 24 hr tablet Take 300 mg by mouth daily. 05/28/20  Yes [provider]  diclofenac (VOLTAREN) 75 MG EC tablet Take 75 mg by mouth 2 (two) times daily. 06/16/20  Yes [provider]  erythromycin ophthalmic ointment Place a 1/2 inch ribbon of ointment into the lower right eyelid 4 times daily for 5 days 02/18/21  Yes Urania Pearlman, Kermit Balo, MD  furosemide (LASIX) 40 MG tablet Take 40 mg by mouth daily. 05/24/20  Yes [provider]  levothyroxine (SYNTHROID) 137 MCG tablet Take by mouth. 07/19/19  Yes [provider]  losartan-hydrochlorothiazide (HYZAAR) 50-12.5 MG tablet Take 1 tablet by mouth daily. 03/05/19  Yes [provider]  Lutein 40 MG CAPS Take by mouth.   Yes  [provider]  cycloSPORINE (RESTASIS) 0.05 % ophthalmic emulsion Apply to eye. 02/13/19   [provider]  gabapentin (NEURONTIN) 300 MG capsule Take 2 capsules (600 mg total) by mouth 3 (three) times daily. 08/08/20   Tyrell Antonio, MD  NON FORMULARY daily.    [provider]  nystatin (MYCOSTATIN/NYSTOP) powder APPLY TOPICALLY 3 TIMES A DAY 01/02/21   Philip Aspen, Limmie Patricia, MD  phentermine 30 MG capsule Take 30 mg by mouth daily. 12/27/20   [provider]  Probiotic Product (PROBIOTIC PO) Take by mouth daily.    [provider]  temazepam (RESTORIL) 30 MG capsule Take 30 mg by mouth at bedtime. 06/19/20   [provider]  traMADol (ULTRAM) 50 MG tablet TAKE 1 TABLET BY MOUTH EVERY 6 HOURS AS NEEDED 12/18/20   Hilts, Casimiro Needle, MD  Vitamin D, Ergocalciferol, (DRISDOL) 1.25 MG (50000 UNIT) CAPS capsule Take 50,000 Units by mouth once a week. 02/22/20   [provider]    Allergies    Lisinopril, Prednisone, Sulfa antibiotics, Codeine, Latex, Nabumetone, and Nickel  Review of Systems   Review of Systems  Constitutional:  Negative for chills and fever.  Eyes:  Positive for pain and redness. Negative for photophobia, discharge, itching and visual disturbance.  Skin:  Negative for color change and rash.  Neurological:  Negative for syncope and headaches.  All other systems reviewed and are negative.  Physical Exam Updated Vital Signs BP (!) 167/89   Pulse (!) 58   Temp 98.8 F (37.1 C)   Resp 20   SpO2 100%   Physical Exam Constitutional:      General: She is not in acute distress. HENT:     Head: Normocephalic and atraumatic.  Eyes:     General: Lids are normal. Lids are everted, no foreign bodies appreciated. Vision grossly intact. Gaze aligned appropriately. No visual field deficit or scleral icterus.       Right eye: No foreign body.        Left eye: No foreign body.     Intraocular pressure: Right eye  pressure is 21 mmHg. Left eye pressure is 20 mmHg.     Extraocular Movements: Extraocular movements intact.     Right eye: Normal extraocular motion and no nystagmus.     Left eye: Normal extraocular motion and no nystagmus.     Conjunctiva/sclera:     Right eye: Right conjunctiva is injected. No exudate or hemorrhage.    Left eye: Left conjunctiva is not injected. No exudate or hemorrhage.    Pupils: Pupils are equal, round, and reactive to light.     Right eye: Pupil is round, reactive and not sluggish. No corneal abrasion. Seidel exam negative.  Left eye: Pupil is round, reactive and not sluggish. No corneal abrasion.     Slit lamp exam:    Right eye: No foreign body, hyphema, hypopyon or photophobia.     Left eye: No corneal ulcer, hyphema, hypopyon or photophobia.     Visual Fields: Right eye visual fields normal and left eye visual fields normal.     Comments: Small streak of flourescene uptake in right lateral conjunctiva (sparing pupil); negative seidel test Vision 20/25 left eye (unaffected eye), 20/20 right eye (affected eye) when corrected for myopia  Cardiovascular:     Rate and Rhythm: Normal rate and regular rhythm.  Skin:    General: Skin is warm and dry.  Neurological:     General: No focal deficit present.     Mental Status: She is alert and oriented to person, place, and time. Mental status is at baseline.    ED Results / Procedures / Treatments   Labs (all labs ordered are listed, but only abnormal results are displayed) Labs Reviewed - No data to display  EKG None  Radiology No results found.  Procedures Procedures   Medications Ordered in ED Medications  fluorescein ophthalmic strip 1 strip (1 strip Both Eyes Given 02/18/21 0947)  fluorescein ophthalmic strip 1 strip (1 strip Both Eyes Given 02/18/21 1129)  tetracaine (PONTOCAINE) 0.5 % ophthalmic solution 2 drop (2 drops Both Eyes Given 02/18/21 1129)  erythromycin ophthalmic ointment (1  application Right Eye Given 02/18/21 1129)    ED Course  I have reviewed the triage vital signs and the nursing notes.  Pertinent labs & imaging results that were available during my care of the patient were reviewed by me and considered in my medical decision making (see chart for details).  Pt here with suspected bullous subjunctival hemorrhage, likely spontaneous - no evidence of trauma, globe rupture, or laceration on exam, per staining or per her history.  No sign of infection.  Not consistent with uveitis.  Ocular pressures normal.  Vision intact.  I suspect she is stable for ophtho office f/u tomorrow.  Clinical Course as of 02/18/21 1227  Sun Feb 18, 2021  1026 Patient is near-sighted.  When corrected for distance, her vision is 20/20 right eye, 20/25 left eye, as confirmed by myself - please disregard nursing charting [MT]  1118 I spoke to Dr Sherrine Maples from ophtho who advised office f/u tomorrow at 10 am, can start on topical erythromycin for now (it's unclear if this is a laceration in the lateral eye or simply floursceine pooling).  No positive seidel test or evidence of globe rupture.  On reassessment there is some minor extension across the sclera, and I explained to pt and her husband this bleeding may eventually become circumferential, but as long as it does not compromise her vision, worsen her pain, or involve the pupil, she should be safe for follow up tomorrow. [MT]    Clinical Course User Index [MT] Renaye Rakers Kermit Balo, MD      Final Clinical Impression(s) / ED Diagnoses Final diagnoses:  Conjunctival hemorrhage of right eye    Rx / DC Orders ED Discharge Orders          Ordered    erythromycin ophthalmic ointment        02/18/21 1122             Terald Sleeper, MD 02/18/21 1227    Terald Sleeper, MD 02/18/21 1227

## 2021-02-18 NOTE — Discharge Instructions (Addendum)
Please arrive at the office 10-15 minutes before 10 AM tomorrow (Monday 02/19/21) to be seen by Dr Sherrine Maples [facility], see circled address above.  Please read over the attached information for subconjunctival hemorrhages.  Pay particular attention to reasons to come back to the ER.  In particular, loss of vision or bleeding into the PUPIL (center colored part of your eye) are timely emergencies that should be seen in the ER.

## 2021-02-20 ENCOUNTER — Telehealth: Payer: Self-pay | Admitting: Internal Medicine

## 2021-02-20 NOTE — Telephone Encounter (Signed)
Pt call and stated she need a refll on buPROPion (WELLBUTRIN XL) 300 MG 24 hr tablet  and buPROPion (WELLBUTRIN XL) 150 MG 24 hr tablet , osartan-hydrochlorothiazide (HYZAAR) 50-12.5 MG tablet  sent to  [id number] - [place], [place] - [address] AT [facility] [address] Phone:  [phone]  Fax:  [phone]

## 2021-02-21 ENCOUNTER — Encounter: Payer: 59 | Admitting: Rehabilitative and Restorative Service Providers"

## 2021-02-21 MED ORDER — BUPROPION HCL ER (XL) 150 MG PO TB24: 150.0000 mg | ORAL_TABLET | Freq: Every morning | ORAL | 0 refills | Status: DC

## 2021-02-21 MED ORDER — LOSARTAN POTASSIUM-HCTZ 50-12.5 MG PO TABS: 1.0000 | ORAL_TABLET | Freq: Every day | ORAL | 0 refills | Status: DC

## 2021-02-21 MED ORDER — BUPROPION HCL ER (XL) 300 MG PO TB24: 300.0000 mg | ORAL_TABLET | Freq: Every day | ORAL | 0 refills | Status: DC

## 2021-02-21 NOTE — Telephone Encounter (Signed)
Refills sent

## 2021-02-26 ENCOUNTER — Encounter: Payer: 59 | Admitting: Rehabilitative and Restorative Service Providers"

## 2021-02-26 ENCOUNTER — Telehealth: Payer: Self-pay | Admitting: Gastroenterology

## 2021-02-26 NOTE — Telephone Encounter (Signed)
Good morning Dr. Myrtie Neither, patient called wanting to established care with [facility].  Will be sending records your way.  Can you please review and advise on scheduling?  Thank you.

## 2021-02-27 ENCOUNTER — Encounter: Payer: Self-pay | Admitting: Internal Medicine

## 2021-02-27 ENCOUNTER — Other Ambulatory Visit: Payer: Self-pay | Admitting: Internal Medicine

## 2021-03-07 ENCOUNTER — Other Ambulatory Visit: Payer: Self-pay

## 2021-03-08 ENCOUNTER — Ambulatory Visit (INDEPENDENT_AMBULATORY_CARE_PROVIDER_SITE_OTHER): Payer: 59 | Admitting: Internal Medicine

## 2021-03-08 ENCOUNTER — Encounter: Payer: Self-pay | Admitting: Internal Medicine

## 2021-03-08 VITALS — BP 120/80 | HR 80 | Temp 98.3°F | Wt 187.9 lb

## 2021-03-08 DIAGNOSIS — Z23 Encounter for immunization: Secondary | ICD-10-CM

## 2021-03-08 DIAGNOSIS — F339 Major depressive disorder, recurrent, unspecified: Secondary | ICD-10-CM

## 2021-03-08 DIAGNOSIS — H9313 Tinnitus, bilateral: Secondary | ICD-10-CM | POA: Diagnosis not present

## 2021-03-08 DIAGNOSIS — M2550 Pain in unspecified joint: Secondary | ICD-10-CM

## 2021-03-08 DIAGNOSIS — Z1211 Encounter for screening for malignant neoplasm of colon: Secondary | ICD-10-CM

## 2021-03-08 MED ORDER — BUPROPION HCL ER (XL) 300 MG PO TB24: 300.0000 mg | ORAL_TABLET | Freq: Every day | ORAL | 0 refills | Status: DC
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
## 2021-03-08 NOTE — Addendum Note (Signed)
Addended by: Kern Reap B on: 03/08/2021 04:34 PM   Modules accepted: Orders

## 2021-03-08 NOTE — Patient Instructions (Signed)
-  Nice seeing you today!!  -Flu vaccine today.  -Remember your COVID booster at the pharmacy.  -Referrals to GI and ENT today.  -See you back in December for your annual physical. Please come in fasting for labs.

## 2021-03-12 NOTE — Telephone Encounter (Signed)
Sharon Anthony,  I reviewed this patient's records from prior GI practice in [place], [place] (Dr. Kandis Fantasia)  (For documentation purposes: Single diminutive TA 08/2011 7-8 diminutive TAs 10/2015 Single diminutive TA 01/2017 3 diminutive TAs 01/2020 - Dr Regino Schultze recommended 3 year recall  Family history of colon cancer)  Based on patient's records and recommendation of her prior GI doctor, recall surveillance colonoscopy 3 year interval from last one. So, that would be September 2024.  If patient would like that to be with this practice, then please place recall with me.  - HD  _________________________________  Tilford Pillar on your patient.    [facility]

## 2021-03-13 ENCOUNTER — Encounter: Payer: Self-pay | Admitting: Internal Medicine

## 2021-03-13 DIAGNOSIS — H9313 Tinnitus, bilateral: Secondary | ICD-10-CM

## 2021-03-13 NOTE — Telephone Encounter (Signed)
Spoke with patient and is aware not due for colon until 01/2023.

## 2021-03-21 ENCOUNTER — Telehealth: Payer: Self-pay | Admitting: Physical Medicine and Rehabilitation

## 2021-03-21 DIAGNOSIS — M25552 Pain in left hip: Secondary | ICD-10-CM

## 2021-03-21 MED ORDER — TRAMADOL HCL 50 MG PO TABS: 50.0000 mg | ORAL_TABLET | Freq: Three times a day (TID) | ORAL | 0 refills | Status: DC | PRN

## 2021-03-21 NOTE — Telephone Encounter (Signed)
Pt asking for a refill of her tramadol prescription, has seen Dr. Prince Rome and Dr. Alvester Morin so is not sure who can refill it. The best pharmacy is the one on file and the best call back number if needed is [phone].

## 2021-03-22 ENCOUNTER — Other Ambulatory Visit: Payer: Self-pay | Admitting: Internal Medicine

## 2021-03-23 MED ORDER — TEMAZEPAM 30 MG PO CAPS: 30.0000 mg | ORAL_CAPSULE | Freq: Every day | ORAL | 2 refills | Status: DC

## 2021-03-23 NOTE — Telephone Encounter (Signed)
Needs to have Vit D levels rechecked

## 2021-04-09 ENCOUNTER — Ambulatory Visit: Payer: 59 | Admitting: Rehabilitative and Restorative Service Providers"

## 2021-04-09 ENCOUNTER — Other Ambulatory Visit: Payer: Self-pay

## 2021-04-09 ENCOUNTER — Encounter: Payer: Self-pay | Admitting: Rehabilitative and Restorative Service Providers"

## 2021-04-09 DIAGNOSIS — M6281 Muscle weakness (generalized): Secondary | ICD-10-CM | POA: Diagnosis not present

## 2021-04-09 DIAGNOSIS — R293 Abnormal posture: Secondary | ICD-10-CM | POA: Diagnosis not present

## 2021-04-09 DIAGNOSIS — M542 Cervicalgia: Secondary | ICD-10-CM | POA: Diagnosis not present
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
## 2021-04-12 ENCOUNTER — Ambulatory Visit (INDEPENDENT_AMBULATORY_CARE_PROVIDER_SITE_OTHER): Payer: 59 | Admitting: Internal Medicine

## 2021-04-12 ENCOUNTER — Encounter: Payer: Self-pay | Admitting: Internal Medicine

## 2021-04-12 VITALS — BP 120/70 | HR 70 | Temp 98.7°F | Ht 67.0 in | Wt 191.2 lb

## 2021-04-12 DIAGNOSIS — F339 Major depressive disorder, recurrent, unspecified: Secondary | ICD-10-CM

## 2021-04-12 DIAGNOSIS — E890 Postprocedural hypothyroidism: Secondary | ICD-10-CM | POA: Diagnosis not present

## 2021-04-12 DIAGNOSIS — Z23 Encounter for immunization: Secondary | ICD-10-CM | POA: Diagnosis not present

## 2021-04-12 DIAGNOSIS — E782 Mixed hyperlipidemia: Secondary | ICD-10-CM

## 2021-04-12 DIAGNOSIS — I10 Essential (primary) hypertension: Secondary | ICD-10-CM

## 2021-04-12 DIAGNOSIS — Z0000 Encounter for general adult medical examination without abnormal findings: Secondary | ICD-10-CM

## 2021-04-12 DIAGNOSIS — F419 Anxiety disorder, unspecified: Secondary | ICD-10-CM

## 2021-04-12 DIAGNOSIS — G4700 Insomnia, unspecified: Secondary | ICD-10-CM

## 2021-04-12 LAB — COMPREHENSIVE METABOLIC PANEL
ALT: 18 U/L (ref 0–35)
AST: 24 U/L (ref 0–37)
Albumin: 4.4 g/dL (ref 3.5–5.2)
Alkaline Phosphatase: 81 U/L (ref 39–117)
BUN: 20 mg/dL (ref 6–23)
CO2: 31 mEq/L (ref 19–32)
Calcium: 9.6 mg/dL (ref 8.4–10.5)
Chloride: 101 mEq/L (ref 96–112)
Creatinine, Ser: 0.82 mg/dL (ref 0.40–1.20)
GFR: 73.46 mL/min (ref >60.00)
Glucose, Bld: 93 mg/dL (ref 70–99)
Potassium: 3.8 mEq/L (ref 3.5–5.1)
Sodium: 140 mEq/L (ref 135–145)
Total Bilirubin: 0.6 mg/dL (ref 0.2–1.2)
Total Protein: 7.2 g/dL (ref 6.0–8.3)

## 2021-04-12 LAB — LIPID PANEL
Cholesterol: 179 mg/dL (ref 0–200)
HDL: 73.4 mg/dL (ref >39.00)
LDL Cholesterol: 90 mg/dL (ref 0–99)
NonHDL: 105.11
Total CHOL/HDL Ratio: 2
Triglycerides: 74 mg/dL (ref 0.0–149.0)
VLDL: 14.8 mg/dL (ref 0.0–40.0)

## 2021-04-12 LAB — VITAMIN B12: Vitamin B-12: 589 pg/mL (ref 211–911)

## 2021-04-12 LAB — CBC WITH DIFFERENTIAL/PLATELET
Basophils Absolute: 0 10*3/uL (ref 0.0–0.1)
Basophils Relative: 1 % (ref 0.0–3.0)
Eosinophils Absolute: 0.2 10*3/uL (ref 0.0–0.7)
Eosinophils Relative: 3.9 % (ref 0.0–5.0)
HCT: 39.2 % (ref 36.0–46.0)
Hemoglobin: 12.8 g/dL (ref 12.0–15.0)
Lymphocytes Relative: 28.9 % (ref 12.0–46.0)
Lymphs Abs: 1.4 10*3/uL (ref 0.7–4.0)
MCHC: 32.7 g/dL (ref 30.0–36.0)
MCV: 88.5 fl (ref 78.0–100.0)
Monocytes Absolute: 0.7 10*3/uL (ref 0.1–1.0)
Monocytes Relative: 13.4 % — ABNORMAL HIGH (ref 3.0–12.0)
Neutro Abs: 2.6 10*3/uL (ref 1.4–7.7)
Neutrophils Relative %: 52.8 % (ref 43.0–77.0)
Platelets: 233 10*3/uL (ref 150.0–400.0)
RBC: 4.43 Mil/uL (ref 3.87–5.11)
RDW: 13.5 % (ref 11.5–15.5)
WBC: 4.9 10*3/uL (ref 4.0–10.5)

## 2021-04-12 LAB — VITAMIN D 25 HYDROXY (VIT D DEFICIENCY, FRACTURES): VITD: 64.43 ng/mL (ref 30.00–100.00)

## 2021-04-12 LAB — HEMOGLOBIN A1C: Hgb A1c MFr Bld: 5.6 % (ref 4.6–6.5)

## 2021-04-12 LAB — TSH: TSH: 2.14 u[IU]/mL (ref 0.35–5.50)
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
## 2021-04-12 NOTE — Patient Instructions (Signed)
-  Nice seeing you today!!  -Lab work today; will notify you once results are available.  -Pneumonia vaccine today.  -Schedule follow up in 6 months

## 2021-04-24 ENCOUNTER — Encounter: Payer: Self-pay | Admitting: Internal Medicine

## 2021-04-25 ENCOUNTER — Encounter: Payer: 59 | Admitting: Rehabilitative and Restorative Service Providers"

## 2021-04-25 MED ORDER — VALACYCLOVIR HCL 500 MG PO TABS: 500.0000 mg | ORAL_TABLET | Freq: Two times a day (BID) | ORAL | 2 refills | Status: DC

## 2021-05-01 ENCOUNTER — Telehealth: Payer: Self-pay | Admitting: Physical Medicine and Rehabilitation

## 2021-05-01 NOTE — Telephone Encounter (Signed)
Pt called requesting refill of gabapentin and tramadol. Please send to pharmacy on file. Pt phone number is [phone].

## 2021-05-02 ENCOUNTER — Encounter: Payer: 59 | Admitting: Rehabilitative and Restorative Service Providers"

## 2021-05-02 ENCOUNTER — Other Ambulatory Visit: Payer: Self-pay | Admitting: Physical Medicine and Rehabilitation

## 2021-05-02 MED ORDER — GABAPENTIN 300 MG PO CAPS: 600.0000 mg | ORAL_CAPSULE | Freq: Three times a day (TID) | ORAL | 0 refills | Status: DC

## 2021-05-02 MED ORDER — TRAMADOL HCL 50 MG PO TABS: 50.0000 mg | ORAL_TABLET | Freq: Three times a day (TID) | ORAL | 0 refills | Status: DC | PRN

## 2021-05-08 ENCOUNTER — Encounter: Payer: 59 | Admitting: Rehabilitative and Restorative Service Providers"

## 2021-05-08 ENCOUNTER — Telehealth: Payer: Self-pay | Admitting: Rehabilitative and Restorative Service Providers"

## 2021-05-08 ENCOUNTER — Other Ambulatory Visit: Payer: Self-pay | Admitting: Physical Medicine and Rehabilitation

## 2021-05-08 DIAGNOSIS — M5416 Radiculopathy, lumbar region: Secondary | ICD-10-CM

## 2021-05-08 NOTE — Telephone Encounter (Signed)
Called patient about scheduled appointment today at 1:45pm.  Pt. Indicated she cancelled it through mychart.    Chyrel Masson, PT, DPT, OCS, ATC 05/08/21  2:04 PM

## 2021-05-21 ENCOUNTER — Encounter: Payer: Self-pay | Admitting: Internal Medicine

## 2021-05-21 NOTE — Telephone Encounter (Signed)
Spoke to pt and she stated that she was trying to send a list of weight loss medications that her insurance will cover. Pt advised that no list was seen. Pt will send list again.

## 2021-05-23 ENCOUNTER — Ambulatory Visit: Payer: 59 | Admitting: Internal Medicine

## 2021-05-23 VITALS — BP 126/70 | HR 66 | Temp 98.1°F | Resp 16 | Wt 193.4 lb

## 2021-05-23 DIAGNOSIS — E669 Obesity, unspecified: Secondary | ICD-10-CM
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
## 2021-05-24 ENCOUNTER — Ambulatory Visit: Payer: Self-pay

## 2021-05-24 ENCOUNTER — Other Ambulatory Visit: Payer: Self-pay | Admitting: Internal Medicine

## 2021-05-24 ENCOUNTER — Encounter: Payer: Self-pay | Admitting: Physical Medicine and Rehabilitation

## 2021-05-24 ENCOUNTER — Other Ambulatory Visit: Payer: Self-pay

## 2021-05-24 ENCOUNTER — Ambulatory Visit: Payer: 59 | Admitting: Physical Medicine and Rehabilitation

## 2021-05-24 VITALS — BP 118/73 | HR 90

## 2021-05-24 DIAGNOSIS — M5416 Radiculopathy, lumbar region: Secondary | ICD-10-CM

## 2021-05-24 MED ORDER — METHYLPREDNISOLONE ACETATE 80 MG/ML IJ SUSP
80.0000 mg | Freq: Once | INTRAMUSCULAR | Status: AC
  Administered 2021-05-24: 80 mg

## 2021-05-24 NOTE — Patient Instructions (Signed)

## 2021-05-24 NOTE — Progress Notes (Signed)
Pt state lower back pain that travels down both legs to her toes. Pt state walking makes the pain worse. Pt state she takes pain meds and uses ice to help ease her pain.  Numeric Pain Rating Scale and Functional Assessment Average Pain 6   In the last MONTH (on 0-10 scale) has pain interfered with the following?  1. General activity like being  able to carry out your everyday physical activities such as walking, climbing stairs, carrying groceries, or moving a chair?  Rating(8)   +Driver, -BT, -Dye Allergies.

## 2021-05-31 ENCOUNTER — Other Ambulatory Visit: Payer: Self-pay | Admitting: Internal Medicine

## 2021-06-01 ENCOUNTER — Telehealth: Payer: Self-pay | Admitting: Physical Medicine and Rehabilitation

## 2021-06-01 MED ORDER — TRAMADOL HCL 50 MG PO TABS: 50.0000 mg | ORAL_TABLET | Freq: Two times a day (BID) | ORAL | 0 refills | Status: DC | PRN

## 2021-06-01 NOTE — Telephone Encounter (Signed)
Patient called. She would like Tramadol called in for her. 

## 2021-06-04 MED ORDER — LEVOTHYROXINE SODIUM 137 MCG PO TABS: 137.0000 ug | ORAL_TABLET | Freq: Every day | ORAL | 1 refills | Status: DC

## 2021-06-04 MED ORDER — VITAMIN D (ERGOCALCIFEROL) 1.25 MG (50000 UNIT) PO CAPS: 50000.0000 [IU] | ORAL_CAPSULE | ORAL | 0 refills | Status: DC

## 2021-06-10 NOTE — Progress Notes (Signed)
Sharon Anthony - 71 y.o. female MRN [id number]  Date of birth: Jan 10, 1951  Office Visit Note: Visit Date: 05/24/2021 PCP: Isaac Bliss, Rayford Halsted, MD Referred by: Isaac Bliss, Estel*  Subjective: Chief Complaint  Patient presents with   Lower Back - Pain   Right Leg - Pain   Left Leg - Pain   Left Foot - Pain   Right Foot - Pain   HPI:  Sharon Anthony is a 71 y.o. female who comes in today at the request of Barnet Pall, FNP for planned Bilateral L5-S1 Lumbar Transforaminal epidural steroid injection with fluoroscopic guidance.  The patient has failed conservative care including home exercise, medications, time and activity modification.  This injection will be diagnostic and hopefully therapeutic.  Please see requesting physician notes for further details and justification.  I did briefly discuss with her the role of spinal cord stimulators and stimulator trials and conditions such as hers.  She did take some information along with her.  I did give Rachel Bo from [facility] her name to contact.  ROS Otherwise per HPI.  Assessment & Plan: Visit Diagnoses:    ICD-10-CM   1. Lumbar radiculopathy  M54.16 XR C-ARM NO REPORT    Epidural Steroid injection    methylPREDNISolone acetate (DEPO-MEDROL) injection 80 mg      Plan: No additional findings.   Meds & Orders:  Meds ordered this encounter  Medications   methylPREDNISolone acetate (DEPO-MEDROL) injection 80 mg    Orders Placed This Encounter  Procedures   XR C-ARM NO REPORT   Epidural Steroid injection    Follow-up: Return for visit to requesting provider as needed.   Procedures: No procedures performed  Lumbosacral Transforaminal Epidural Steroid Injection - Sub-Pedicular Approach with Fluoroscopic Guidance  [name]      Date of Birth: 04-Dec-1950 MRN: [id number] PCP: Isaac Bliss, Rayford Halsted, MD      Visit Date: 05/24/2021   Universal Protocol:    Date/Time: 05/24/2021  Consent  Given By: the patient  Position: PRONE  Additional Comments: Vital signs were monitored before and after the procedure. Patient was prepped and draped in the usual sterile fashion. The correct patient, procedure, and site was verified.   Injection Procedure Details:   Procedure diagnoses: Lumbar radiculopathy [M54.16]    Meds Administered:  Meds ordered this encounter  Medications   methylPREDNISolone acetate (DEPO-MEDROL) injection 80 mg    Laterality: Bilateral  Location/Site: L5  Needle:5.0 in., 22 ga.  Short bevel or Quincke spinal needle  Needle Placement: Transforaminal  Findings:    -Comments: Excellent flow of contrast along the nerve, nerve root and into the epidural space.  Procedure Details: After squaring off the end-plates to get a true AP view, the C-arm was positioned so that an oblique view of the foramen as noted above was visualized. The target area is just inferior to the "nose of the scotty dog" or sub pedicular. The soft tissues overlying this structure were infiltrated with 2-3 ml. of 1% Lidocaine without Epinephrine.  The spinal needle was inserted toward the target using a "trajectory" view along the fluoroscope beam.  Under AP and lateral visualization, the needle was advanced so it did not puncture dura and was located close the 6 O'Clock position of the pedical in AP tracterory. Biplanar projections were used to confirm position. Aspiration was confirmed to be negative for CSF and/or blood. A 1-2 ml. volume of Isovue-250 was injected and flow of contrast was noted  at each level. Radiographs were obtained for documentation purposes.   After attaining the desired flow of contrast documented above, a 0.5 to 1.0 ml test dose of 0.25% Marcaine was injected into each respective transforaminal space.  The patient was observed for 90 seconds post injection.  After no sensory deficits were reported, and normal lower extremity motor function was noted,   the  above injectate was administered so that equal amounts of the injectate were placed at each foramen (level) into the transforaminal epidural space.   Additional Comments:  The patient tolerated the procedure well Dressing: 2 x 2 sterile gauze and Band-Aid    Post-procedure details: Patient was observed during the procedure. Post-procedure instructions were reviewed.  Patient left the clinic in stable condition.    Clinical History: MRI LUMBAR SPINE WITHOUT CONTRAST   TECHNIQUE: Multiplanar, multisequence MR imaging of the lumbar spine was performed. No intravenous contrast was administered.   COMPARISON:  None.   FINDINGS: Segmentation:  Normal   Alignment:  Mild retrolisthesis L2-3.  7 mm anterolisthesis L4-5.   Vertebrae:  Negative for fracture or mass.   Conus medullaris and cauda equina: Conus extends to the L1-2 level. Conus and cauda equina appear normal.   Paraspinal and other soft tissues: Negative for paraspinous mass or adenopathy.   Disc levels:   Disc degeneration and Schmorl's nodes at T10-11 and T11-T12 and T12-L1 without significant spinal stenosis   L1-2: Disc degeneration and disc bulging with Schmorl's node. Negative for stenosis   L2-3: Mild retrolisthesis. Mild disc bulging and mild facet degeneration. Mild subarticular stenosis bilaterally.   L3-4: Bilateral laminectomy. Spinal canal normal in size. Disc degeneration with disc bulging and bilateral facet hypertrophy. Moderate subarticular and foraminal stenosis bilaterally left greater than right   L4-5: Bilateral laminectomy with adequate decompression of spinal canal. 6 mm anterolisthesis. Moderately large central disc protrusion. Bilateral facet hypertrophy. Moderate to severe subarticular and foraminal stenosis on the right. Moderate subarticular stenosis on the left.   L5-S1: Disc degeneration with diffuse disc bulging and endplate spurring. Bilateral facet hypertrophy. Severe  foraminal encroachment bilaterally due to spurring.   IMPRESSION: Bilateral laminectomy L3-4. Endplate spurring and facet hypertrophy contribute to moderate subarticular and foraminal stenosis bilaterally left greater than right   Bilateral laminectomy L4-5 with 7 mm anterolisthesis. Moderate to severe subarticular and foraminal stenosis on the right and moderate subarticular stenosis on the left   Severe foraminal encroachment bilaterally L5-S1 due to spurring.     Electronically Signed By: Franchot Gallo M.D. On: 06/28/2020 13:27 ----  EXAM: MR CERVICAL SPINE WO CONTRAST  TECHNIQUE: MRI of the cervical spine was obtained utilizing 3 mm sagittal slices from the posterior fossa down to the T3-4 level with T1, T2 and inversion recovery views. In addition 4 mm axial slices from [id number] down to T1-2 level were included with T2 and gradient echo views. CONTRAST: none  COMPARISON: none  IMAGING SITE: [facility] [address] (1.5 Tesla MRI)    FINDINGS:    On sagittal views the vertebral bodies have normal height and alignment.  Benign hemangioma at T1 vertebral body.  Mild degenerative spondylosis and disc bulging from C3-4 down to C6-7.  Schmorl's nodes and endplate disease noted at C5-6.  The spinal cord is normal in size and appearance. The posterior fossa, pituitary gland and paraspinal soft tissues are unremarkable.     On axial views: C2-3 no spinal stenosis or foraminal narrowing C3-4 uncovertebral joint hypertrophy with mild spinal stenosis and  moderate bilateral foraminal stenosis C4-5 disc bulging and facet hypertrophy with mild spinal stenosis and moderate bilateral foraminal stenosis C5-6 uncovertebral joint hypertrophy with moderate left foraminal stenosis C6-7 disc bulging with borderline mild spinal stenosis and no foraminal narrowing C7-T1 uncovertebral joint hypertrophy with moderate bilateral foraminal stenosis   Limited views of the soft tissues of  the head and neck are unremarkable.     IMPRESSION:    MRI cervical spine without contrast imaging: - Mild multilevel spondylosis and disc bulging from C3-4 to C7-T1.  Multilevel foraminal stenosis as above.  - Borderline mild spinal stenosis noted at C3-4, C4-5 and C6-7.  No cord signal abnormalities.         INTERPRETING PHYSICIAN:  Penni Bombard, MD Certified in Neurology, Neurophysiology and Neuroimaging     Objective:  VS:  HT:     WT:    BMI:      BP:118/73   HR:90bpm   TEMP: ( )   RESP:  Physical Exam Vitals and nursing note reviewed.  Constitutional:      General: She is not in acute distress.    Appearance: Normal appearance. She is not ill-appearing.  HENT:     Head: Normocephalic and atraumatic.     Right Ear: External ear normal.     Left Ear: External ear normal.  Eyes:     Extraocular Movements: Extraocular movements intact.  Cardiovascular:     Rate and Rhythm: Normal rate.     Pulses: Normal pulses.  Pulmonary:     Effort: Pulmonary effort is normal. No respiratory distress.  Abdominal:     General: There is no distension.     Palpations: Abdomen is soft.  Musculoskeletal:        General: Tenderness present.     Cervical back: Neck supple.     Right lower leg: No edema.     Left lower leg: No edema.     Comments: Patient has good distal strength with no pain over the greater trochanters.  No clonus or focal weakness.  Skin:    Findings: No erythema, lesion or rash.  Neurological:     General: No focal deficit present.     Mental Status: She is alert and oriented to person, place, and time.     Sensory: No sensory deficit.     Motor: No weakness or abnormal muscle tone.     Coordination: Coordination normal.  Psychiatric:        Mood and Affect: Mood normal.        Behavior: Behavior normal.     Imaging: No results found.

## 2021-06-10 NOTE — Procedures (Signed)
Lumbosacral Transforaminal Epidural Steroid Injection - Sub-Pedicular Approach with Fluoroscopic Guidance  [name]      Date of Birth: 1950-07-16 MRN: [id number] PCP: Philip Aspen, Limmie Patricia, MD      Visit Date: 05/24/2021   Universal Protocol:    Date/Time: 05/24/2021  Consent Given By: the patient  Position: PRONE  Additional Comments: Vital signs were monitored before and after the procedure. Patient was prepped and draped in the usual sterile fashion. The correct patient, procedure, and site was verified.   Injection Procedure Details:   Procedure diagnoses: Lumbar radiculopathy [M54.16]    Meds Administered:  Meds ordered this encounter  Medications   methylPREDNISolone acetate (DEPO-MEDROL) injection 80 mg    Laterality: Bilateral  Location/Site: L5  Needle:5.0 in., 22 ga.  Short bevel or Quincke spinal needle  Needle Placement: Transforaminal  Findings:    -Comments: Excellent flow of contrast along the nerve, nerve root and into the epidural space.  Procedure Details: After squaring off the end-plates to get a true AP view, the C-arm was positioned so that an oblique view of the foramen as noted above was visualized. The target area is just inferior to the "nose of the scotty dog" or sub pedicular. The soft tissues overlying this structure were infiltrated with 2-3 ml. of 1% Lidocaine without Epinephrine.  The spinal needle was inserted toward the target using a "trajectory" view along the fluoroscope beam.  Under AP and lateral visualization, the needle was advanced so it did not puncture dura and was located close the 6 O'Clock position of the pedical in AP tracterory. Biplanar projections were used to confirm position. Aspiration was confirmed to be negative for CSF and/or blood. A 1-2 ml. volume of Isovue-250 was injected and flow of contrast was noted at each level. Radiographs were obtained for documentation purposes.   After attaining the  desired flow of contrast documented above, a 0.5 to 1.0 ml test dose of 0.25% Marcaine was injected into each respective transforaminal space.  The patient was observed for 90 seconds post injection.  After no sensory deficits were reported, and normal lower extremity motor function was noted,   the above injectate was administered so that equal amounts of the injectate were placed at each foramen (level) into the transforaminal epidural space.   Additional Comments:  The patient tolerated the procedure well Dressing: 2 x 2 sterile gauze and Band-Aid    Post-procedure details: Patient was observed during the procedure. Post-procedure instructions were reviewed.  Patient left the clinic in stable condition.

## 2021-06-17 ENCOUNTER — Other Ambulatory Visit: Payer: Self-pay | Admitting: Internal Medicine

## 2021-06-17 DIAGNOSIS — F339 Major depressive disorder, recurrent, unspecified: Secondary | ICD-10-CM

## 2021-06-21 ENCOUNTER — Other Ambulatory Visit: Payer: Self-pay | Admitting: Physical Medicine and Rehabilitation

## 2021-06-21 DIAGNOSIS — G894 Chronic pain syndrome: Secondary | ICD-10-CM

## 2021-06-21 DIAGNOSIS — M961 Postlaminectomy syndrome, not elsewhere classified: Secondary | ICD-10-CM

## 2021-06-21 DIAGNOSIS — M5416 Radiculopathy, lumbar region: Secondary | ICD-10-CM

## 2021-06-21 NOTE — Progress Notes (Unsigned)
Neuropsychological evaluation with Dr. Lora Paula placed for spinal cord stimulator trial. Tillie Rung from [facility] contacted and will call patient.

## 2021-06-29 ENCOUNTER — Telehealth: Payer: Self-pay | Admitting: Physical Medicine and Rehabilitation

## 2021-06-29 NOTE — Telephone Encounter (Signed)
Spoke with patient on the telephone this morning, she had neuropsychiatric evaluation this past Wednesday and we are waiting on fax from Dr. Ardyth Man office. Patient states she is getting new insurance in March [facility]) and is unsure if this will cover SCS procedure. We will wait and file the trial and implant on her new insurance in March. Patient states she will be in touch. She has no further questions.

## 2021-07-11 ENCOUNTER — Other Ambulatory Visit: Payer: Self-pay | Admitting: Internal Medicine

## 2021-07-12 ENCOUNTER — Other Ambulatory Visit: Payer: Self-pay | Admitting: Physical Medicine and Rehabilitation

## 2021-07-22 ENCOUNTER — Other Ambulatory Visit: Payer: Self-pay | Admitting: Internal Medicine

## 2021-08-11 ENCOUNTER — Encounter: Payer: Self-pay | Admitting: Internal Medicine

## 2021-08-13 ENCOUNTER — Other Ambulatory Visit: Payer: Self-pay | Admitting: Physical Medicine and Rehabilitation

## 2021-08-13 ENCOUNTER — Other Ambulatory Visit: Payer: Self-pay | Admitting: Internal Medicine

## 2021-08-13 DIAGNOSIS — B372 Candidiasis of skin and nail: Secondary | ICD-10-CM

## 2021-08-13 MED ORDER — TRAMADOL HCL 50 MG PO TABS: ORAL_TABLET | ORAL | 0 refills | Status: DC

## 2021-08-13 MED ORDER — AMLODIPINE BESYLATE 5 MG PO TABS: 5.0000 mg | ORAL_TABLET | Freq: Every day | ORAL | 0 refills | Status: DC

## 2021-08-13 MED ORDER — NYSTATIN 100000 UNIT/GM EX POWD: Freq: Two times a day (BID) | CUTANEOUS | 0 refills | Status: DC

## 2021-08-29 ENCOUNTER — Other Ambulatory Visit: Payer: Self-pay | Admitting: Internal Medicine

## 2021-08-29 MED ORDER — FUROSEMIDE 40 MG PO TABS: 40.0000 mg | ORAL_TABLET | Freq: Every day | ORAL | 1 refills | Status: DC

## 2021-08-29 MED ORDER — VALACYCLOVIR HCL 500 MG PO TABS: 500.0000 mg | ORAL_TABLET | Freq: Two times a day (BID) | ORAL | 1 refills | Status: DC

## 2021-08-31 ENCOUNTER — Encounter: Payer: Self-pay | Admitting: Internal Medicine

## 2021-08-31 DIAGNOSIS — F339 Major depressive disorder, recurrent, unspecified: Secondary | ICD-10-CM

## 2021-09-03 MED ORDER — BUPROPION HCL ER (XL) 300 MG PO TB24: 300.0000 mg | ORAL_TABLET | Freq: Every day | ORAL | 1 refills | Status: DC

## 2021-09-03 MED ORDER — VALACYCLOVIR HCL 500 MG PO TABS: 500.0000 mg | ORAL_TABLET | Freq: Two times a day (BID) | ORAL | 1 refills | Status: DC

## 2021-09-03 MED ORDER — BUPROPION HCL ER (XL) 150 MG PO TB24: 150.0000 mg | ORAL_TABLET | Freq: Every day | ORAL | 1 refills | Status: DC

## 2021-10-10 ENCOUNTER — Encounter: Payer: Self-pay | Admitting: Internal Medicine

## 2021-10-10 ENCOUNTER — Ambulatory Visit (INDEPENDENT_AMBULATORY_CARE_PROVIDER_SITE_OTHER): Payer: Medicare Other | Admitting: Internal Medicine

## 2021-10-10 VITALS — BP 120/74 | HR 76 | Temp 97.9°F | Wt 178.7 lb

## 2021-10-10 DIAGNOSIS — H04123 Dry eye syndrome of bilateral lacrimal glands: Secondary | ICD-10-CM | POA: Diagnosis not present

## 2021-10-10 DIAGNOSIS — M222X2 Patellofemoral disorders, left knee: Secondary | ICD-10-CM

## 2021-10-10 MED ORDER — CYCLOSPORINE 0.05 % OP EMUL: 1.0000 [drp] | Freq: Two times a day (BID) | OPHTHALMIC | 2 refills | Status: DC

## 2021-10-10 NOTE — Progress Notes (Signed)
Established Patient Office Visit     CC/Reason for Visit: Discuss left knee pain  HPI: Sharon Anthony is a 71 y.o. female who is coming in today for the above mentioned reasons.  For about 2 months has had left knee pain that she feels has worsened in the last 2 weeks.  This coincides with a beach vacation where she had to climb 4 sets of steps to her down every day.  Pain is in the anterior part of her knee in front of the patella.  It is more difficult for her to go down steps then to go up steps.  Sometimes she will hear a click in her knee with extension.  No swelling that she has noticed.  Past Medical/Surgical History: Past Medical History:  Diagnosis Date   Cervicogenic headache 03/05/2019   Concussion with no loss of consciousness 03/05/2019   Hypertension     Past Surgical History:  Procedure Laterality Date   LUMBAR LAMINECTOMY     L3-4 and L4-5    Social History:  reports that she quit smoking about 25 years ago. Her smoking use included cigarettes. She has never used smokeless tobacco. She reports current alcohol use of about 7.0 standard drinks per week. She reports that she does not use drugs.  Allergies: Allergies  Allergen Reactions   Lisinopril     Other reaction(s): Cough   Prednisone     Other reaction(s): Anxious   Sulfa Antibiotics Other (See Comments)    Other reaction(s): Unspecified    Codeine Other (See Comments)    Other reaction(s): Unspecified    Latex Dermatitis    with prolonged contact only with prolonged contact only   Nabumetone Rash   Nickel Dermatitis    reactions to cheek jewelry reactions to cheek jewelry    Family History:  Family History  Problem Relation Age of Onset   Transient ischemic attack Mother    Dementia Mother    Cancer Father    Psoriasis Brother    Arthritis Other    High blood pressure Other        "everybody"   Parkinson's disease Maternal Uncle    Neuropathy Neg Hx      Current Outpatient  Medications:    amLODipine (NORVASC) 5 MG tablet, Take 1 tablet (5 mg total) by mouth daily., Disp: 90 tablet, Rfl: 0   atorvastatin (LIPITOR) 20 MG tablet, Take 20 mg by mouth daily., Disp: , Rfl:    buPROPion (WELLBUTRIN XL) 150 MG 24 hr tablet, Take 1 tablet (150 mg total) by mouth daily., Disp: 30 tablet, Rfl: 1   buPROPion (WELLBUTRIN XL) 300 MG 24 hr tablet, Take 1 tablet (300 mg total) by mouth daily., Disp: 30 tablet, Rfl: 1   furosemide (LASIX) 40 MG tablet, Take 1 tablet (40 mg total) by mouth daily., Disp: 90 tablet, Rfl: 1   gabapentin (NEURONTIN) 300 MG capsule, Take 2 capsules (600 mg total) by mouth 3 (three) times daily., Disp: 180 capsule, Rfl: 0   levothyroxine (SYNTHROID) 137 MCG tablet, Take 1 tablet (137 mcg total) by mouth daily before breakfast., Disp: 90 tablet, Rfl: 1   losartan-hydrochlorothiazide (HYZAAR) 50-12.5 MG tablet, Take 1 tablet by mouth daily., Disp: 90 tablet, Rfl: 1   Lutein 40 MG CAPS, Take by mouth., Disp: , Rfl:    NON FORMULARY, daily., Disp: , Rfl:    nystatin (MYCOSTATIN/NYSTOP) powder, Apply topically 2 (two) times daily., Disp: 15 g, Rfl: 0  Probiotic Product (PROBIOTIC PO), Take by mouth daily., Disp: , Rfl:    temazepam (RESTORIL) 30 MG capsule, TAKE 1 CAPSULE (30 MG TOTAL) BY MOUTH AT BEDTIME., Disp: 30 capsule, Rfl: 2   traMADol (ULTRAM) 50 MG tablet, TAKE 1 TABLET BY MOUTH EVERY 12 (TWELVE) HOURS AS NEEDED FOR MODERATE PAIN OR SEVERE PAIN., Disp: 30 tablet, Rfl: 0   valACYclovir (VALTREX) 500 MG tablet, Take 1 tablet (500 mg total) by mouth 2 (two) times daily., Disp: 60 tablet, Rfl: 1   Vitamin D, Ergocalciferol, (DRISDOL) 1.25 MG (50000 UNIT) CAPS capsule, Take 1 capsule (50,000 Units total) by mouth once a week., Disp: 12 capsule, Rfl: 0   cycloSPORINE (RESTASIS) 0.05 % ophthalmic emulsion, Place 1 drop into both eyes 2 (two) times daily., Disp: 0.4 mL, Rfl: 2  Review of Systems:  Constitutional: Denies fever, chills, diaphoresis, appetite  change and fatigue.  HEENT: Denies photophobia, eye pain, redness, hearing loss, ear pain, congestion, sore throat, rhinorrhea, sneezing, mouth sores, trouble swallowing, neck pain, neck stiffness and tinnitus.   Respiratory: Denies SOB, DOE, cough, chest tightness,  and wheezing.   Cardiovascular: Denies chest pain, palpitations and leg swelling.  Gastrointestinal: Denies nausea, vomiting, abdominal pain, diarrhea, constipation, blood in stool and abdominal distention.  Genitourinary: Denies dysuria, urgency, frequency, hematuria, flank pain and difficulty urinating.  Endocrine: Denies: hot or cold intolerance, sweats, changes in hair or nails, polyuria, polydipsia. Musculoskeletal: Denies myalgias, back pain, joint swelling. Skin: Denies pallor, rash and wound.  Neurological: Denies dizziness, seizures, syncope, weakness, light-headedness, numbness and headaches.  Hematological: Denies adenopathy. Easy bruising, personal or family bleeding history  Psychiatric/Behavioral: Denies suicidal ideation, mood changes, confusion, nervousness, sleep disturbance and agitation    Physical Exam: Vitals:   10/10/21 1048  BP: 120/74  Pulse: 76  Temp: 97.9 F (36.6 C)  TempSrc: Oral  SpO2: 97%  Weight: 178 lb 11.2 oz (81.1 kg)    Body mass index is 27.99 kg/m.   Constitutional: NAD, calm, comfortable Eyes: PERRL, lids and conjunctivae normal ENMT: Mucous membranes are moist.  Musculoskeletal: no clubbing / cyanosis. No joint deformity upper and lower extremities. Good ROM, no contractures. Normal muscle tone.  No edema of left knee, no erythema, no ligament laxity noted Psychiatric: Normal judgment and insight. Alert and oriented x 3. Normal mood.    Impression and Plan:  Patellofemoral pain syndrome of left knee -We have discussed importance of chronic weight loss from bracing, icing, she has been provided with some exercises she may do to improve function.  If no improvement can  consider sports medicine referral.  Dry eyes  - Plan: cycloSPORINE (RESTASIS) 0.05 % ophthalmic emulsion    Time spent:22 minutes reviewing chart, interviewing and examining patient and formulating plan of car    Ellayna Hilligoss Philip Aspen, MD [facility]

## 2021-10-12 ENCOUNTER — Ambulatory Visit (INDEPENDENT_AMBULATORY_CARE_PROVIDER_SITE_OTHER): Payer: Medicare Other | Admitting: Surgical

## 2021-10-12 ENCOUNTER — Ambulatory Visit (INDEPENDENT_AMBULATORY_CARE_PROVIDER_SITE_OTHER): Payer: Medicare Other

## 2021-10-12 DIAGNOSIS — M25562 Pain in left knee: Secondary | ICD-10-CM | POA: Diagnosis not present

## 2021-10-14 ENCOUNTER — Encounter: Payer: Self-pay | Admitting: Surgical

## 2021-10-14 NOTE — Progress Notes (Signed)
Office Visit Note   [name]           Date of Birth: Sep 21, 1950           MRN: [id number] Visit Date: 10/12/2021 Requested by: Philip Aspen, Limmie Patricia, MD [address] [place],  [place] [address] PCP: Philip Aspen, Limmie Patricia, MD  Subjective: Chief Complaint  Patient presents with   Left Knee - Pain    HPI: Sharon Anthony is a 71 y.o. female who presents to the office complaining of left knee pain.  Patient states that she has had increasing pain the last 2 weeks that has become constant.  She has been doing a lot of walking and started a gym workout routine about 3 months ago.  Describes anterior and posterior left knee pain.  She has associated nighttime pain and occasional rest pain.  No groin pain or other joint pain is bothering her currently.  No history of diabetes, smoking.  No history of cancer.  Takes gabapentin and Tylenol along with ice use which has helped somewhat.  She was staying at a townhouse with a friend which had 3-4 separate sets of stairs that she had to a send and descend in order to navigate the townhome which she feels is causing increased pain..                ROS: All systems reviewed are negative as they relate to the chief complaint within the history of present illness.  Patient denies fevers or chills.  Assessment & Plan: Visit Diagnoses:  1. Left knee pain, unspecified chronicity     Plan: Patient is a 71 year old female who presents for evaluation of left knee pain.  She has had increased pain over the last 2 weeks without injury.  No significant degenerative changes noted on her left knee radiographs taken today.  However, there is a fairly well-circumscribed sclerotic irregularity noted that communicates with the medial cortex of the distal femur.  Plan for further evaluation of this with MRI left femur with and without contrast.  Discussed this finding with Dr. August Saucer.  More likely benign rather than malignancy but patient was  given a prescription for a walker and instructed to not put any weight on her left leg until MRI results are back.  Patient agreed with plan.  Plan to call her with MRI results to discuss next steps based on results.  Follow-Up Instructions: No follow-ups on file.   Orders:  Orders Placed This Encounter  Procedures   XR KNEE 3 VIEW LEFT   MR FEMUR LEFT W WO CONTRAST   No orders of the defined types were placed in this encounter.     Procedures: No procedures performed   Clinical Data: No additional findings.  Objective: Vital Signs: There were no vitals taken for this visit.  Physical Exam:  Constitutional: Patient appears well-developed HEENT:  Head: Normocephalic Eyes:EOM are normal Neck: Normal range of motion Cardiovascular: Normal rate Pulmonary/chest: Effort normal Neurologic: Patient is alert Skin: Skin is warm Psychiatric: Patient has normal mood and affect  Ortho Exam: Ortho exam demonstrates left knee without effusion.  Tenderness over the medial joint line as well as the medial aspect of the distal femur.  Mild tenderness over the lateral joint line.  0 degrees extension and 120 degrees of knee flexion.  No pain with hip range of motion.  Able to perform straight leg raise without extensor lag.  No masses palpable.  Specialty Comments:  No specialty comments available.  Imaging: No results found.   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    Encounter for other specified surgical aftercare 03/18/2014   Acquired hallux valgus 01/03/2014   Past Medical History:  Diagnosis Date   Cervicogenic headache 03/05/2019   Concussion with no loss of consciousness 03/05/2019   Hypertension     Family History  Problem Relation Age of Onset   Transient ischemic attack Mother    Dementia Mother    Cancer Father    Psoriasis Brother    Arthritis Other    High blood pressure Other        "everybody"   Parkinson's disease Maternal Uncle    Neuropathy Neg Hx     Past Surgical History:  Procedure Laterality Date   LUMBAR LAMINECTOMY     L3-4 and L4-5   Social History   Occupational History   Not on file  Tobacco Use   Smoking status: Former    Types: Cigarettes    Quit date: 1998    Years since quitting: 25.4   Smokeless tobacco: Never  Vaping Use   Vaping Use: Never used  Substance and Sexual Activity   Alcohol use: Yes    Alcohol/week: 7.0 standard drinks of alcohol    Types: 7 Standard drinks or equivalent per week    Comment: vodka, sometimes wine    Drug use: Never   Sexual activity: Not on file

## 2021-10-15 ENCOUNTER — Other Ambulatory Visit: Payer: Self-pay | Admitting: Internal Medicine

## 2021-10-16 ENCOUNTER — Ambulatory Visit
Admission: RE | Admit: 2021-10-16 | Discharge: 2021-10-16 | Disposition: A | Discharge status: Home / Self Care | Payer: Medicare Other | Source: Ambulatory Visit | Attending: Surgical | Admitting: Surgical

## 2021-10-16 DIAGNOSIS — M25562 Pain in left knee: Secondary | ICD-10-CM

## 2021-10-16 MED ORDER — GADOBENATE DIMEGLUMINE 529 MG/ML IV SOLN
17.0000 mL | Freq: Once | INTRAVENOUS | Status: AC | PRN
Start: 2021-10-16 — End: 2021-10-16
  Administered 2021-10-16: 17 mL via INTRAVENOUS

## 2021-10-16 NOTE — Progress Notes (Signed)
Sharon Anthony, you and I talked about follow-up.  Can she come in for a knee injection tomorrow at 1:45 PM?  I informed her about her MRI results.  Fortunately not anything concerning.

## 2021-10-17 ENCOUNTER — Encounter: Payer: Self-pay | Admitting: Surgical

## 2021-10-17 ENCOUNTER — Ambulatory Visit (INDEPENDENT_AMBULATORY_CARE_PROVIDER_SITE_OTHER): Payer: Medicare Other | Admitting: Surgical

## 2021-10-17 DIAGNOSIS — M25462 Effusion, left knee: Secondary | ICD-10-CM | POA: Diagnosis not present

## 2021-10-17 MED ORDER — BUPIVACAINE HCL 0.25 % IJ SOLN
4.0000 mL | INTRAMUSCULAR | Status: AC | PRN
  Administered 2021-10-17: 4 mL via INTRA_ARTICULAR

## 2021-10-17 MED ORDER — METHYLPREDNISOLONE ACETATE 40 MG/ML IJ SUSP
40.0000 mg | INTRAMUSCULAR | Status: AC | PRN
  Administered 2021-10-17: 40 mg via INTRA_ARTICULAR

## 2021-10-17 MED ORDER — LIDOCAINE HCL 1 % IJ SOLN
5.0000 mL | INTRAMUSCULAR | Status: AC | PRN
  Administered 2021-10-17: 5 mL

## 2021-10-17 NOTE — Progress Notes (Signed)
   Procedure Note  [name]             Date of Birth: 1950-08-14           MRN: [id number]             Visit Date: 10/17/2021  Procedures: Visit Diagnoses:  1. Effusion, left knee     Large Joint Inj on 10/17/2021 2:29 PM Indications: diagnostic evaluation, joint swelling and pain Details: 18 G 1.5 in needle, superolateral approach  Arthrogram: No  Medications: 5 mL lidocaine 1 %; 40 mg methylPREDNISolone acetate 40 MG/ML; 4 mL bupivacaine 0.25 % Aspirate: 4 mL Outcome: tolerated well, no immediate complications Procedure, treatment alternatives, risks and benefits explained, specific risks discussed. Consent was given by the patient. Immediately prior to procedure a time out was called to verify the correct patient, procedure, equipment, support staff and site/side marked as required. Patient was prepped and draped in the usual sterile fashion.

## 2021-10-22 ENCOUNTER — Other Ambulatory Visit: Payer: Self-pay | Admitting: Internal Medicine

## 2021-11-09 ENCOUNTER — Encounter: Payer: Self-pay | Admitting: Internal Medicine

## 2021-11-12 MED ORDER — ATORVASTATIN CALCIUM 20 MG PO TABS: 20.0000 mg | ORAL_TABLET | Freq: Every day | ORAL | 1 refills | Status: DC

## 2021-11-14 ENCOUNTER — Other Ambulatory Visit: Payer: Self-pay | Admitting: Internal Medicine

## 2021-11-14 ENCOUNTER — Encounter: Payer: Self-pay | Admitting: Internal Medicine

## 2021-11-14 MED ORDER — ATORVASTATIN CALCIUM 20 MG PO TABS: 20.0000 mg | ORAL_TABLET | Freq: Every day | ORAL | 1 refills | Status: DC

## 2021-11-19 ENCOUNTER — Encounter: Payer: Self-pay | Admitting: Internal Medicine

## 2021-11-19 MED ORDER — LOSARTAN POTASSIUM-HCTZ 50-12.5 MG PO TABS: 1.0000 | ORAL_TABLET | Freq: Every day | ORAL | 0 refills | Status: DC

## 2021-11-21 ENCOUNTER — Other Ambulatory Visit: Payer: Self-pay | Admitting: Internal Medicine

## 2021-11-21 DIAGNOSIS — B372 Candidiasis of skin and nail: Secondary | ICD-10-CM

## 2021-11-21 MED ORDER — NYSTATIN 100000 UNIT/GM EX POWD: Freq: Two times a day (BID) | CUTANEOUS | 0 refills | Status: DC

## 2021-11-23 ENCOUNTER — Other Ambulatory Visit: Payer: Self-pay | Admitting: Internal Medicine

## 2021-11-28 ENCOUNTER — Ambulatory Visit: Payer: PRIVATE HEALTH INSURANCE | Admitting: Orthopedic Surgery

## 2021-12-10 ENCOUNTER — Encounter: Payer: Self-pay | Admitting: Internal Medicine

## 2021-12-10 MED ORDER — LEVOTHYROXINE SODIUM 137 MCG PO TABS: 137.0000 ug | ORAL_TABLET | Freq: Every day | ORAL | 1 refills | Status: DC

## 2021-12-14 ENCOUNTER — Encounter: Payer: Self-pay | Admitting: Internal Medicine

## 2021-12-15 ENCOUNTER — Other Ambulatory Visit: Payer: Self-pay | Admitting: Internal Medicine

## 2021-12-15 DIAGNOSIS — B372 Candidiasis of skin and nail: Secondary | ICD-10-CM

## 2021-12-17 MED ORDER — LEVOTHYROXINE SODIUM 137 MCG PO TABS: 137.0000 ug | ORAL_TABLET | Freq: Every day | ORAL | 1 refills | Status: DC

## 2021-12-17 NOTE — Addendum Note (Signed)
Addended by: Kern Reap B on: 12/17/2021 07:49 AM   Modules accepted: Orders

## 2021-12-20 ENCOUNTER — Ambulatory Visit: Payer: PRIVATE HEALTH INSURANCE | Admitting: Orthopedic Surgery

## 2021-12-24 ENCOUNTER — Encounter: Payer: Self-pay | Admitting: Orthopedic Surgery

## 2021-12-24 ENCOUNTER — Ambulatory Visit (INDEPENDENT_AMBULATORY_CARE_PROVIDER_SITE_OTHER): Payer: Medicare Other | Admitting: Orthopedic Surgery

## 2021-12-24 DIAGNOSIS — M25462 Effusion, left knee: Secondary | ICD-10-CM

## 2021-12-24 DIAGNOSIS — M25562 Pain in left knee: Secondary | ICD-10-CM

## 2021-12-24 MED ORDER — METHYLPREDNISOLONE ACETATE 40 MG/ML IJ SUSP
40.0000 mg | INTRAMUSCULAR | Status: AC | PRN
  Administered 2021-12-24: 40 mg via INTRA_ARTICULAR

## 2021-12-24 MED ORDER — LIDOCAINE HCL 1 % IJ SOLN
5.0000 mL | INTRAMUSCULAR | Status: AC | PRN
  Administered 2021-12-24: 5 mL

## 2021-12-24 MED ORDER — BUPIVACAINE HCL 0.25 % IJ SOLN
4.0000 mL | INTRAMUSCULAR | Status: AC | PRN
  Administered 2021-12-24: 4 mL via INTRA_ARTICULAR

## 2021-12-24 NOTE — Progress Notes (Signed)
Office Visit Note   [name]           Date of Birth: 11-17-1950           MRN: [id number] Visit Date: 12/24/2021 Requested by: Philip Aspen, Limmie Patricia, MD [address] [place],  [place] [address] PCP: Philip Aspen, Limmie Patricia, MD  Subjective: Chief Complaint  Patient presents with   Left Knee - Follow-up    HPI: Sharon Anthony is a 71 year old patient with left knee pain.  Had aspiration and injection on 10/17/2021.  She did very well with that until 2 weeks ago.  She went hiking which was a very vigorous hike and has had pain since that time.  Takes Ultram with good relief.  Reports pain on the front and back of the left knee.  Hard for her to kneel.  That height also exacerbated some pre-existing back pain she had.  Has been using heat and ice.  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  No knee MRI scan has been performed yet.              ROS: All systems reviewed are negative as they relate to the chief complaint within the history of present illness.  Patient denies  fevers or chills.   Assessment & Plan: Visit Diagnoses:  1. Effusion, left knee     Plan: Impression is left knee pain.  Good response to injection over 6 weeks ago.  Plan is repeat injection today.  She does have another trip coming up.  Would like to see her back in 4 weeks for clinical recheck and decision for or against dedicated MRI scan of the left knee.  She also needs 70-month recheck just for plain radiographs on this distal femoral lesion medially in the metaphysis of the left knee.  All images are reviewed with the patient today.  Follow-Up Instructions: Return in about 4 weeks (around 01/21/2022).   Orders:  No orders of the defined types were placed in this encounter.  No orders of the defined types were placed in this encounter.     Procedures: Large Joint Inj: L knee on 12/24/2021 10:33 PM Indications: diagnostic evaluation, joint swelling  and pain Details: 18 G 1.5 in needle, superolateral approach  Arthrogram: No  Medications: 5 mL lidocaine 1 %; 40 mg methylPREDNISolone acetate 40 MG/ML; 4 mL bupivacaine 0.25 % Outcome: tolerated well, no immediate complications Procedure, treatment alternatives, risks and benefits explained, specific risks discussed. Consent was given by the patient. Immediately prior to procedure a time out was called to verify the correct patient, procedure, equipment, support staff and site/side marked as required. Patient was prepped and draped in the usual sterile fashion.       Clinical Data: No additional findings.  Objective: Vital Signs: There were no vitals taken for this visit.  Physical Exam:   Constitutional: Patient appears well-developed HEENT:  Head: Normocephalic Eyes:EOM are normal Neck: Normal range of motion Cardiovascular: Normal rate Pulmonary/chest: Effort normal Neurologic: Patient is alert Skin: Skin is warm Psychiatric: Patient has normal mood and affect   Ortho Exam: Ortho exam demonstrates full active and passive range of motion of the left knee.  No effusion.  Collateral crucial ligaments are stable.  Has medial greater than lateral joint line tenderness.  No Baker's cyst is palpable.  Pedal pulses palpable.  Ankle dorsiflexion intact.  Specialty Comments:  No specialty comments available.  Imaging: No  results found.   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   Thoracic and lumbosacral  neuritis 04/20/2014   Encounter for other specified surgical aftercare 03/18/2014   Acquired hallux valgus 01/03/2014   Past Medical History:  Diagnosis Date   Cervicogenic headache 03/05/2019   Concussion with no loss of consciousness 03/05/2019   Hypertension     Family History  Problem Relation Age of Onset   Transient ischemic attack Mother    Dementia Mother    Cancer Father    Psoriasis Brother    Arthritis Other    High blood pressure Other        "everybody"   Parkinson's disease Maternal Uncle    Neuropathy Neg Hx     Past Surgical History:  Procedure Laterality Date   LUMBAR LAMINECTOMY     L3-4 and L4-5   Social History   Occupational History   Not on file  Tobacco Use   Smoking status: Former    Types: Cigarettes    Quit date: 1998    Years since quitting: 25.6   Smokeless tobacco: Never  Vaping Use   Vaping Use: Never used  Substance and Sexual Activity   Alcohol use: Yes    Alcohol/week: 7.0 standard drinks of alcohol    Types: 7 Standard drinks or equivalent per week    Comment: vodka, sometimes wine    Drug use: Never   Sexual activity: Not on file

## 2022-01-12 ENCOUNTER — Encounter: Payer: Self-pay | Admitting: Physical Medicine and Rehabilitation

## 2022-01-14 ENCOUNTER — Other Ambulatory Visit: Payer: Self-pay | Admitting: Physical Medicine and Rehabilitation

## 2022-01-14 MED ORDER — TRAMADOL HCL 50 MG PO TABS: ORAL_TABLET | ORAL | 0 refills | Status: DC

## 2022-01-18 ENCOUNTER — Other Ambulatory Visit: Payer: Self-pay | Admitting: Internal Medicine

## 2022-01-18 DIAGNOSIS — B372 Candidiasis of skin and nail: Secondary | ICD-10-CM

## 2022-01-21 ENCOUNTER — Ambulatory Visit (INDEPENDENT_AMBULATORY_CARE_PROVIDER_SITE_OTHER): Payer: Medicare Other | Admitting: Internal Medicine

## 2022-01-21 ENCOUNTER — Encounter: Payer: Self-pay | Admitting: Internal Medicine

## 2022-01-21 VITALS — BP 118/60 | HR 74 | Temp 98.4°F | Ht 67.0 in | Wt 181.6 lb

## 2022-01-21 DIAGNOSIS — Z23 Encounter for immunization: Secondary | ICD-10-CM | POA: Diagnosis not present

## 2022-01-21 DIAGNOSIS — K635 Polyp of colon: Secondary | ICD-10-CM

## 2022-01-21 DIAGNOSIS — R768 Other specified abnormal immunological findings in serum: Secondary | ICD-10-CM

## 2022-01-21 DIAGNOSIS — Z1283 Encounter for screening for malignant neoplasm of skin: Secondary | ICD-10-CM | POA: Diagnosis not present

## 2022-01-21 DIAGNOSIS — Z800 Family history of malignant neoplasm of digestive organs: Secondary | ICD-10-CM

## 2022-01-21 MED ORDER — VALACYCLOVIR HCL 500 MG PO TABS: 500.0000 mg | ORAL_TABLET | Freq: Two times a day (BID) | ORAL | 1 refills | Status: DC

## 2022-01-21 NOTE — Progress Notes (Signed)
Established Patient Office Visit     CC/Reason for Visit: Discuss some acute concerns  HPI: Sharon Anthony is a 71 y.o. female who is coming in today for the above mentioned reasons.  She is here today for some acute concerns:  1.  She is requesting a flu vaccine.  2.  She has HSV and takes valacyclovir when she has an outbreak.  She needs a refill.  3.  She had her last colonoscopy in September 2021 while living in [place].  She has had multiple precancerous polyps removed and her father had colon cancer so she is on a 2-year schedule.  She is requesting GI referral.  4.  She is requesting referral to dermatology for skin cancer screening  Past Medical/Surgical History: Past Medical History:  Diagnosis Date   Cervicogenic headache 03/05/2019   Concussion with no loss of consciousness 03/05/2019   Hypertension     Past Surgical History:  Procedure Laterality Date   LUMBAR LAMINECTOMY     L3-4 and L4-5    Social History:  reports that she quit smoking about 25 years ago. Her smoking use included cigarettes. She has never used smokeless tobacco. She reports current alcohol use of about 7.0 standard drinks of alcohol per week. She reports that she does not use drugs.  Allergies: Allergies  Allergen Reactions   Lisinopril     Other reaction(s): Cough   Prednisone     Other reaction(s): Anxious   Sulfa Antibiotics Other (See Comments)    Other reaction(s): Unspecified    Codeine Other (See Comments)    Other reaction(s): Unspecified    Latex Dermatitis    with prolonged contact only with prolonged contact only   Nabumetone Rash   Nickel Dermatitis    reactions to cheek jewelry reactions to cheek jewelry    Family History:  Family History  Problem Relation Age of Onset   Transient ischemic attack Mother    Dementia Mother    Cancer Father    Psoriasis Brother    Arthritis Other    High blood pressure Other        "everybody"   Parkinson's  disease Maternal Uncle    Neuropathy Neg Hx      Current Outpatient Medications:    amLODipine (NORVASC) 5 MG tablet, TAKE 1 TABLET (5 MG TOTAL) BY MOUTH DAILY., Disp: 90 tablet, Rfl: 1   atorvastatin (LIPITOR) 20 MG tablet, Take 1 tablet (20 mg total) by mouth daily., Disp: 90 tablet, Rfl: 1   buPROPion (WELLBUTRIN XL) 150 MG 24 hr tablet, TAKE 1 TABLET BY MOUTH EVERY DAY, Disp: 90 tablet, Rfl: 1   buPROPion (WELLBUTRIN XL) 300 MG 24 hr tablet, Take 1 tablet (300 mg total) by mouth daily., Disp: 30 tablet, Rfl: 1   cycloSPORINE (RESTASIS) 0.05 % ophthalmic emulsion, Place 1 drop into both eyes 2 (two) times daily., Disp: 0.4 mL, Rfl: 2   furosemide (LASIX) 40 MG tablet, Take 1 tablet (40 mg total) by mouth daily., Disp: 90 tablet, Rfl: 1   levothyroxine (SYNTHROID) 137 MCG tablet, Take 1 tablet (137 mcg total) by mouth daily before breakfast., Disp: 90 tablet, Rfl: 1   losartan-hydrochlorothiazide (HYZAAR) 50-12.5 MG tablet, Take 1 tablet by mouth daily., Disp: 90 tablet, Rfl: 0   Lutein 40 MG CAPS, Take by mouth., Disp: , Rfl:    NON FORMULARY, daily., Disp: , Rfl:    NYSTATIN powder, APPLY  POWDER TOPICALLY TWICE DAILY, Disp: 15 g, Rfl:  0   Probiotic Product (PROBIOTIC PO), Take by mouth daily., Disp: , Rfl:    temazepam (RESTORIL) 30 MG capsule, TAKE 1 CAPSULE (30 MG TOTAL) BY MOUTH AT BEDTIME., Disp: 30 capsule, Rfl: 2   traMADol (ULTRAM) 50 MG tablet, TAKE 1 TABLET BY MOUTH EVERY 12 (TWELVE) HOURS AS NEEDED FOR MODERATE PAIN OR SEVERE PAIN., Disp: 30 tablet, Rfl: 0   valACYclovir (VALTREX) 500 MG tablet, Take 1 tablet (500 mg total) by mouth 2 (two) times daily., Disp: 60 tablet, Rfl: 1  Review of Systems:  Constitutional: Denies fever, chills, diaphoresis, appetite change and fatigue.  HEENT: Denies photophobia, eye pain, redness, hearing loss, ear pain, congestion, sore throat, rhinorrhea, sneezing, mouth sores, trouble swallowing, neck pain, neck stiffness and tinnitus.    Respiratory: Denies SOB, DOE, cough, chest tightness,  and wheezing.   Cardiovascular: Denies chest pain, palpitations and leg swelling.  Gastrointestinal: Denies nausea, vomiting, abdominal pain, diarrhea, constipation, blood in stool and abdominal distention.  Genitourinary: Denies dysuria, urgency, frequency, hematuria, flank pain and difficulty urinating.  Endocrine: Denies: hot or cold intolerance, sweats, changes in hair or nails, polyuria, polydipsia. Musculoskeletal: Denies myalgias, back pain, joint swelling, arthralgias and gait problem.  Skin: Denies pallor, rash and wound.  Neurological: Denies dizziness, seizures, syncope, weakness, light-headedness, numbness and headaches.  Hematological: Denies adenopathy. Easy bruising, personal or family bleeding history  Psychiatric/Behavioral: Denies suicidal ideation, mood changes, confusion, nervousness, sleep disturbance and agitation    Physical Exam: Vitals:   01/21/22 1519  BP: 118/60  Pulse: 74  Temp: 98.4 F (36.9 C)  SpO2: 96%  Weight: 181 lb 9.6 oz (82.4 kg)  Height: 5\' 7"  (1.702 m)    Body mass index is 28.44 kg/m.   Constitutional: NAD, calm, comfortable Eyes: PERRL, lids and conjunctivae normal ENMT: Mucous membranes are moist.  Respiratory: clear to auscultation bilaterally, no wheezing, no crackles. Normal respiratory effort. No accessory muscle use.  Cardiovascular: Regular rate and rhythm, no murmurs / rubs / gallops. No extremity edema. Psychiatric: Normal judgment and insight. Alert and oriented x 3. Normal mood.    Impression and Plan:  Need for influenza vaccination -Flu vaccine administered today.  Skin cancer screening - Plan: Ambulatory referral to Dermatology  Polyp of colon, unspecified part of colon, unspecified type - Plan: Ambulatory referral to Gastroenterology  Family history of colon cancer - Plan: Ambulatory referral to Gastroenterology  HSV-2 seropositive - Plan: valACYclovir  (VALTREX) 500 MG tablet    Time spent:31 minutes reviewing chart, interviewing and examining patient and formulating plan of care.     , MD [facility]

## 2022-01-22 ENCOUNTER — Encounter: Payer: Self-pay | Admitting: Internal Medicine

## 2022-01-22 ENCOUNTER — Other Ambulatory Visit: Payer: Self-pay | Admitting: Internal Medicine

## 2022-01-22 DIAGNOSIS — G479 Sleep disorder, unspecified: Secondary | ICD-10-CM

## 2022-01-22 MED ORDER — TEMAZEPAM 30 MG PO CAPS: 30.0000 mg | ORAL_CAPSULE | Freq: Every day | ORAL | 2 refills | Status: DC

## 2022-01-25 ENCOUNTER — Ambulatory Visit (INDEPENDENT_AMBULATORY_CARE_PROVIDER_SITE_OTHER): Payer: Medicare Other | Admitting: Orthopedic Surgery

## 2022-01-25 DIAGNOSIS — M899 Disorder of bone, unspecified: Secondary | ICD-10-CM | POA: Diagnosis not present

## 2022-01-26 ENCOUNTER — Encounter: Payer: Self-pay | Admitting: Orthopedic Surgery

## 2022-01-26 ENCOUNTER — Encounter: Payer: Self-pay | Admitting: Internal Medicine

## 2022-01-26 NOTE — Progress Notes (Signed)
Office Visit Note   [name]           Date of Birth: 23-Dec-1950           MRN: [id number] Visit Date: 01/25/2022 Requested by: Philip Aspen, Limmie Patricia, MD [address] [place],  [place] [address] PCP: Philip Aspen, Limmie Patricia, MD  Subjective: Chief Complaint  Patient presents with   Left Leg - Follow-up    HPI: Sharon Anthony is a 71 year old patient with left knee pain.  She did get good relief from the injection on 10/17/2021.  No pain since the injection for the most part.  She did have the left thigh MRI scan which showed a cortical lesion which was benign in appearance on the medial aspect of the distal femur.  Overall she is making good progress with her range of motion and ambulatory ability.  She is very active.              ROS: All systems reviewed are negative as they relate to the chief complaint within the history of present illness.  Patient denies  fevers or chills.   Assessment & Plan: Visit Diagnoses:  1. Lesion of femur     Plan: Impression is good relief left knee pain with injection.  Plan is per radiology recommendation repeat MRI arthrogram left femur to evaluate any change in that femoral lesion.  Looks like benign nonossifying fibroma.  Final MRI arthrogram in December with follow-up after that.  Follow-Up Instructions: No follow-ups on file.   Orders:  Orders Placed This Encounter  Procedures   MR FEMUR LEFT W WO CONTRAST   No orders of the defined types were placed in this encounter.     Procedures: No procedures performed   Clinical Data: No additional findings.  Objective: Vital Signs: There were no vitals taken for this visit.  Physical Exam:   Constitutional: Patient appears well-developed HEENT:  Head: Normocephalic Eyes:EOM are normal Neck: Normal range of motion Cardiovascular: Normal rate Pulmonary/chest: Effort normal Neurologic: Patient is alert Skin: Skin is warm Psychiatric: Patient has normal mood and  affect   Ortho Exam: Ortho exam demonstrates normal gait and alignment.  No effusion in either knee.  Full active and passive range of motion of both hips knees and ankle.  Excellent quad strength on the left.  No tenderness to palpation around the distal femur.  No palpable masses in that left leg region or thigh region.  Specialty Comments:  No specialty comments available.  Imaging: No results found.   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   Encounter for other specified surgical aftercare 03/18/2014   Acquired hallux valgus 01/03/2014   Past Medical History:  Diagnosis Date   Cervicogenic headache 03/05/2019   Concussion with no loss of consciousness 03/05/2019   Hypertension     Family History  Problem Relation Age of Onset   Transient ischemic attack Mother    Dementia Mother    Cancer Father    Psoriasis Brother    Arthritis Other  High blood pressure Other        "everybody"   Parkinson's disease Maternal Uncle    Neuropathy Neg Hx     Past Surgical History:  Procedure Laterality Date   LUMBAR LAMINECTOMY     L3-4 and L4-5   Social History   Occupational History   Not on file  Tobacco Use   Smoking status: Former    Types: Cigarettes    Quit date: 1998    Years since quitting: 25.7   Smokeless tobacco: Never  Vaping Use   Vaping Use: Never used  Substance and Sexual Activity   Alcohol use: Yes    Alcohol/week: 7.0 standard drinks of  alcohol    Types: 7 Standard drinks or equivalent per week    Comment: vodka, sometimes wine    Drug use: Never   Sexual activity: Not on file

## 2022-01-28 ENCOUNTER — Other Ambulatory Visit: Payer: Self-pay | Admitting: Physical Medicine and Rehabilitation

## 2022-01-28 MED ORDER — TRAMADOL HCL 50 MG PO TABS
ORAL_TABLET | ORAL | 0 refills | Status: DC
Start: 2022-01-28 — End: 2022-03-11

## 2022-02-06 IMAGING — MR MR LUMBAR SPINE W/O CM
4 of 5 series · 27 of 48 positions shown · non-contrast
Comparison: None.

CLINICAL DATA: Osteoarthritis. Lumbosacral back pain. Lumbar
radiculopathy with left leg pain.

EXAM:
MRI LUMBAR SPINE WITHOUT CONTRAST
TECHNIQUE: Multiplanar, multisequence MR imaging of the lumbar spine was
performed. No intravenous contrast was administered.

[Series 2: T2 · sagittal · 4.0mm · 1.09mm/px · 6 of 17 slices shown (1 of 2)]
[im 1/17]
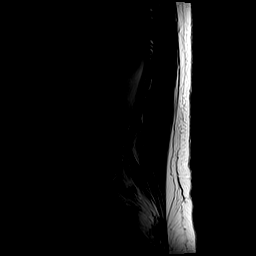
[im 4/17]
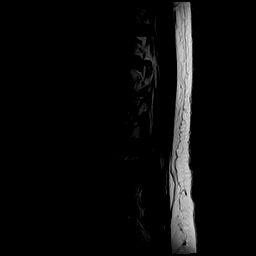
[im 7/17]
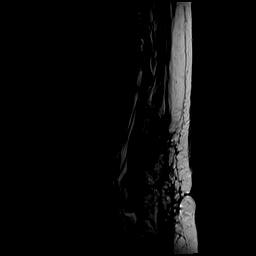
[im 10/17]
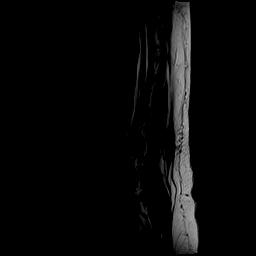
[im 13/17]
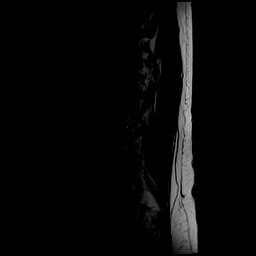
[im 17/17]
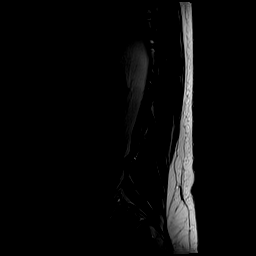

[Series 4: T1 · sagittal · 4.0mm · 1.09mm/px · 6 of 17 slices shown (1 of 2)]
[im 1/17]
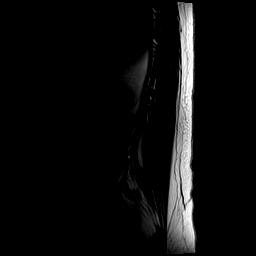
[im 4/17]
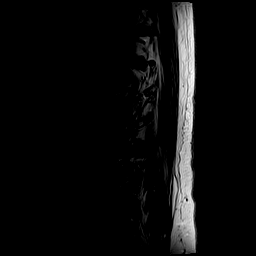
[im 7/17]
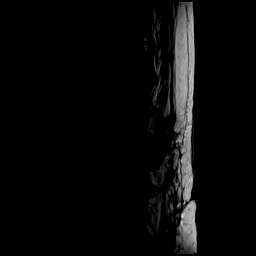
[im 10/17]
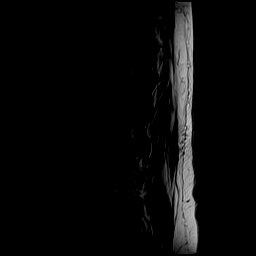
[im 13/17]
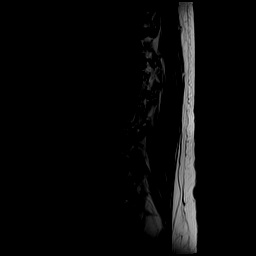
[im 17/17]
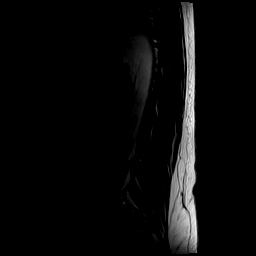

[Series 5: T2 · axial · 4.0mm · 0.39mm/px · z∈[-23,+177]mm · 9 of 42 slices shown (2 of 2)]
[im 1/42]
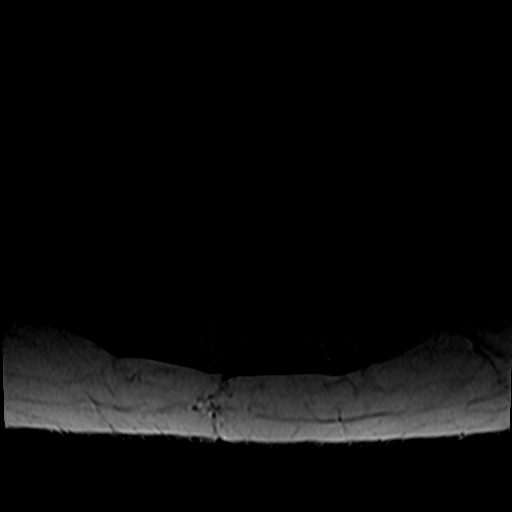
[im 6/42]
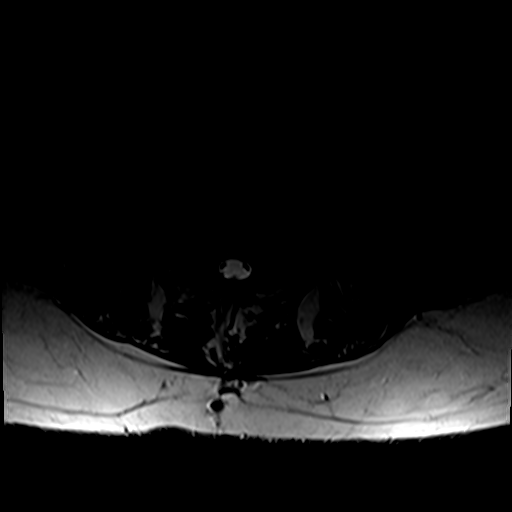
[im 12/42]
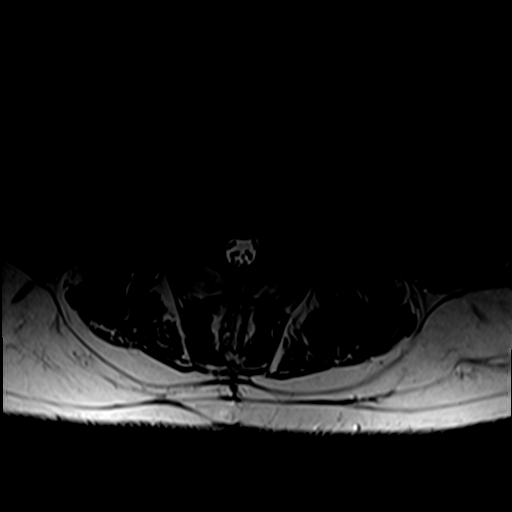
[im 18/42]
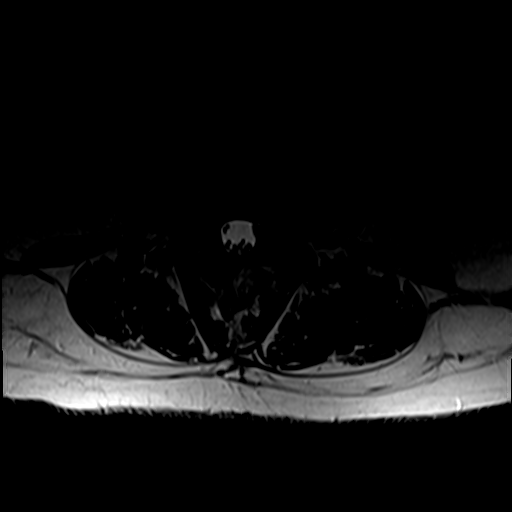
[im 21/42]
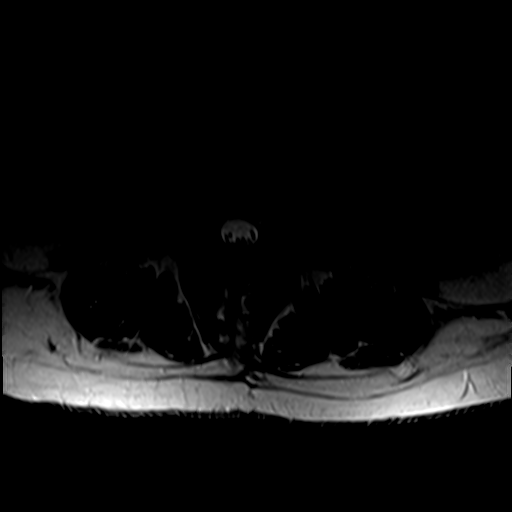
[im 24/42]
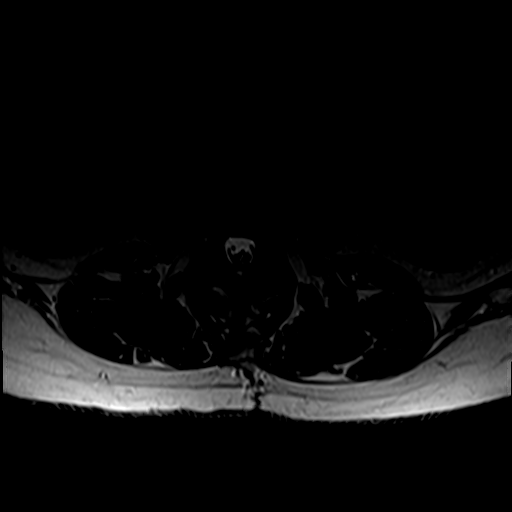
[im 30/42]
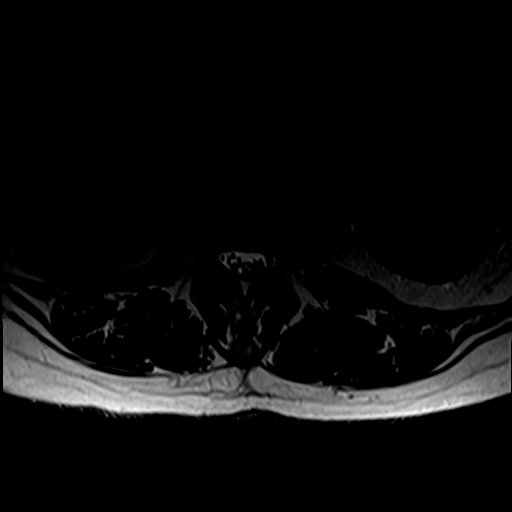
[im 36/42]
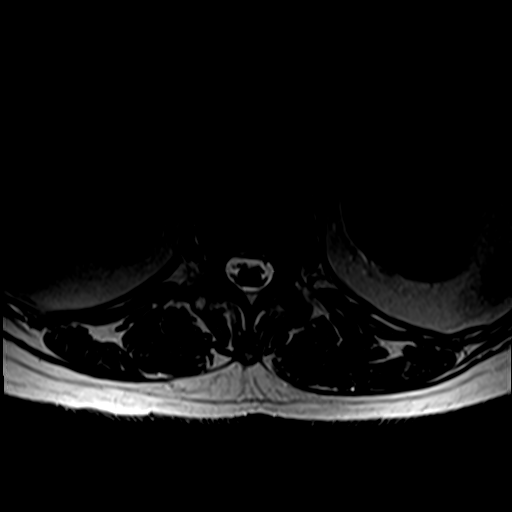
[im 42/42]
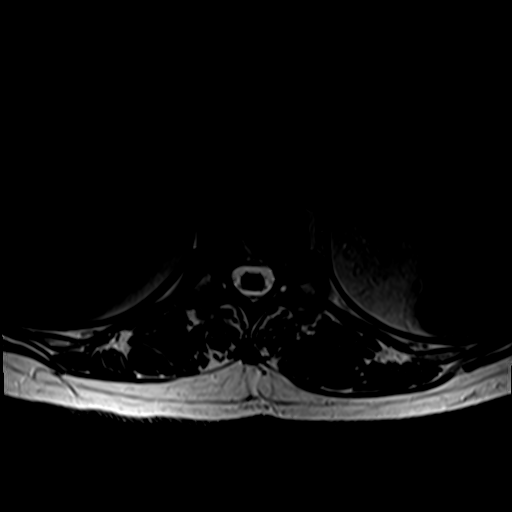

[Series 6: T1 · axial · 4.0mm · 0.39mm/px · z∈[-23,+148]mm · 6 of 42 slices shown (2 of 2)]
[im 1/42]
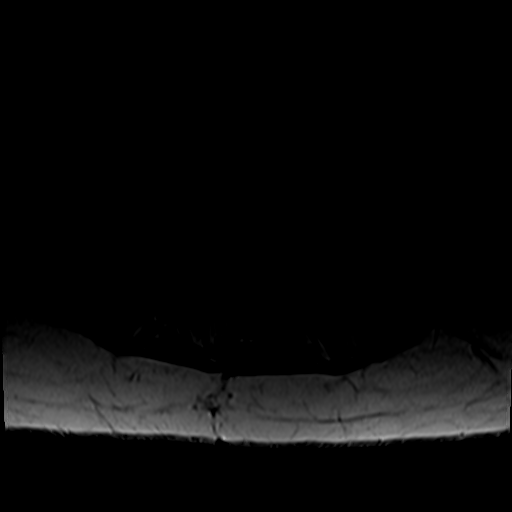
[im 6/42]
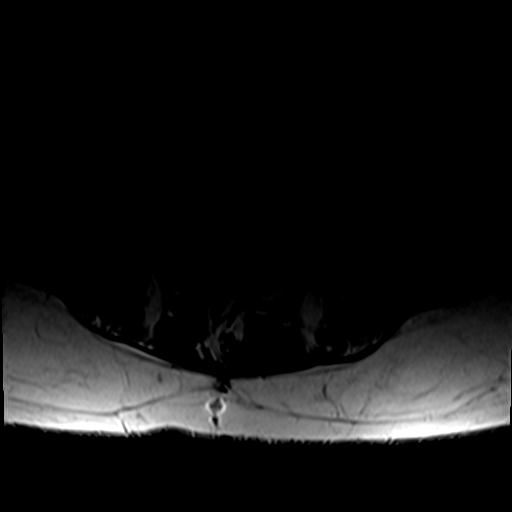
[im 12/42]
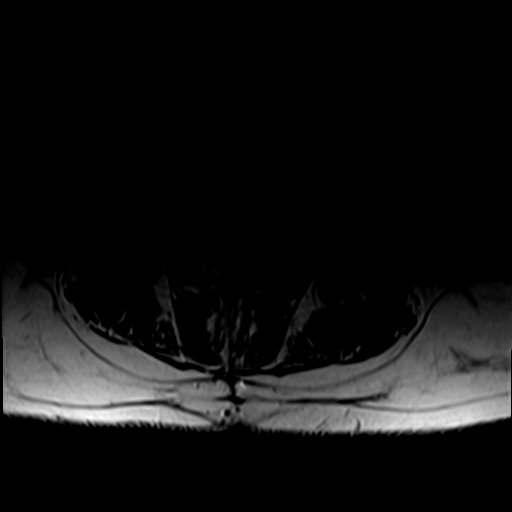
[im 18/42]
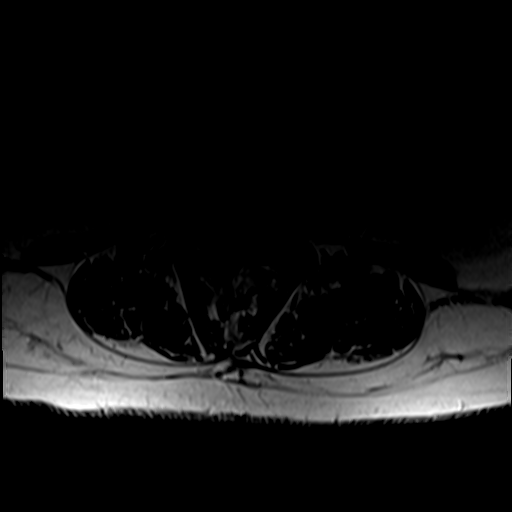
[im 21/42]
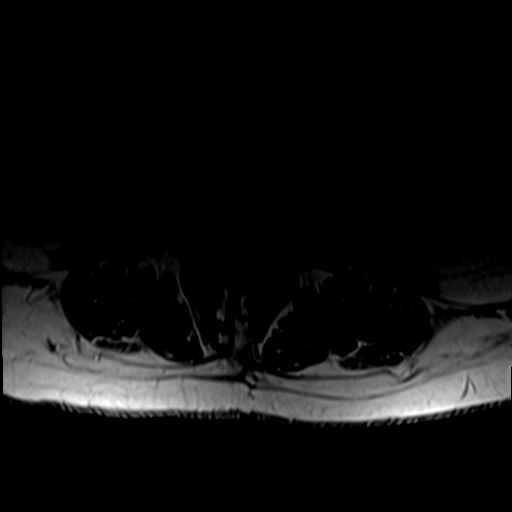
[im 36/42]
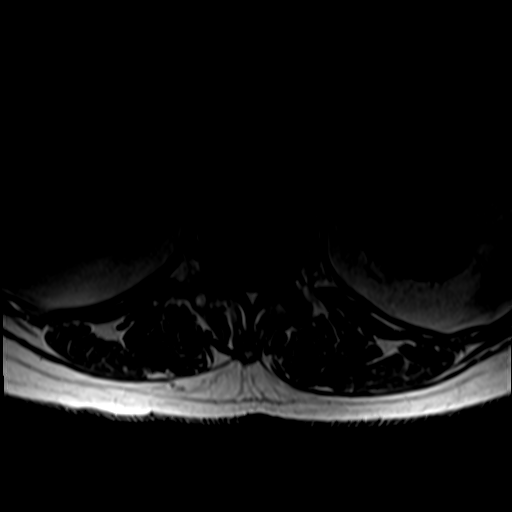

[27 of 48 positions shown; findings below may reference images not displayed]

FINDINGS: Segmentation:  Normal

Alignment:  Mild retrolisthesis L2-3.  7 mm anterolisthesis L4-5.

Vertebrae:  Negative for fracture or mass.

Conus medullaris and cauda equina: Conus extends to the L1-2 level.
Conus and cauda equina appear normal.

Paraspinal and other soft tissues: Negative for paraspinous mass or
adenopathy.

Disc levels:

Disc degeneration and Schmorl's nodes at T10-11 and T11-T12 and
T12-L1 without significant spinal stenosis

L1-2: Disc degeneration and disc bulging with Schmorl's node.
Negative for stenosis

L2-3: Mild retrolisthesis. Mild disc bulging and mild facet
degeneration. Mild subarticular stenosis bilaterally.

L3-4: Bilateral laminectomy. Spinal canal normal in size. Disc
degeneration with disc bulging and bilateral facet hypertrophy.
Moderate subarticular and foraminal stenosis bilaterally left
greater than right

L4-5: Bilateral laminectomy with adequate decompression of spinal
canal. 6 mm anterolisthesis. Moderately large central disc
protrusion. Bilateral facet hypertrophy. Moderate to severe
subarticular and foraminal stenosis on the right. Moderate
subarticular stenosis on the left.

L5-S1: Disc degeneration with diffuse disc bulging and endplate
spurring. Bilateral facet hypertrophy. Severe foraminal encroachment
bilaterally due to spurring.
IMPRESSION: Bilateral laminectomy L3-4. Endplate spurring and facet hypertrophy
contribute to moderate subarticular and foraminal stenosis
bilaterally left greater than right

Bilateral laminectomy L4-5 with 7 mm anterolisthesis. Moderate to
severe subarticular and foraminal stenosis on the right and moderate
subarticular stenosis on the left

Severe foraminal encroachment bilaterally L5-S1 due to spurring.

## 2022-02-13 ENCOUNTER — Other Ambulatory Visit: Payer: Self-pay | Admitting: Internal Medicine

## 2022-02-14 ENCOUNTER — Ambulatory Visit
Admission: RE | Admit: 2022-02-14 | Discharge: 2022-02-14 | Disposition: A | Discharge status: Home / Self Care | Payer: Medicare Other | Source: Ambulatory Visit | Attending: Orthopedic Surgery | Admitting: Orthopedic Surgery

## 2022-02-14 ENCOUNTER — Other Ambulatory Visit: Payer: Self-pay | Admitting: Internal Medicine

## 2022-02-14 DIAGNOSIS — M899 Disorder of bone, unspecified: Secondary | ICD-10-CM

## 2022-02-14 DIAGNOSIS — B372 Candidiasis of skin and nail: Secondary | ICD-10-CM

## 2022-02-14 MED ORDER — GADOPICLENOL 0.5 MMOL/ML IV SOLN
8.0000 mL | Freq: Once | INTRAVENOUS | Status: AC | PRN
Start: 2022-02-14 — End: 2022-02-14
  Administered 2022-02-14: 8 mL via INTRAVENOUS

## 2022-02-16 ENCOUNTER — Other Ambulatory Visit: Payer: Self-pay | Admitting: Internal Medicine

## 2022-02-20 NOTE — Progress Notes (Signed)
I called.

## 2022-03-11 ENCOUNTER — Other Ambulatory Visit: Payer: Self-pay | Admitting: Physical Medicine and Rehabilitation

## 2022-03-11 MED ORDER — TRAMADOL HCL 50 MG PO TABS: ORAL_TABLET | ORAL | 0 refills | Status: DC

## 2022-03-16 ENCOUNTER — Other Ambulatory Visit: Payer: Self-pay | Admitting: Internal Medicine

## 2022-03-16 DIAGNOSIS — F339 Major depressive disorder, recurrent, unspecified: Secondary | ICD-10-CM

## 2022-03-20 ENCOUNTER — Ambulatory Visit (INDEPENDENT_AMBULATORY_CARE_PROVIDER_SITE_OTHER): Payer: Medicare Other | Admitting: Physical Medicine and Rehabilitation

## 2022-03-20 ENCOUNTER — Ambulatory Visit: Payer: Medicare Other

## 2022-03-20 ENCOUNTER — Ambulatory Visit: Payer: Self-pay

## 2022-03-20 DIAGNOSIS — M5416 Radiculopathy, lumbar region: Secondary | ICD-10-CM

## 2022-03-20 MED ORDER — METHYLPREDNISOLONE ACETATE 80 MG/ML IJ SUSP
40.0000 mg | Freq: Once | INTRAMUSCULAR | Status: AC
  Administered 2022-03-20: 40 mg

## 2022-03-20 MED ORDER — METHYLPREDNISOLONE ACETATE 80 MG/ML IJ SUSP: 80.0000 mg | Freq: Once | INTRAMUSCULAR | Status: DC

## 2022-03-20 NOTE — Progress Notes (Signed)
Numeric Pain Rating Scale and Functional Assessment Average Pain 6   In the last MONTH (on 0-10 scale) has pain interfered with the following?  1. General activity like being  able to carry out your everyday physical activities such as walking, climbing stairs, carrying groceries, or moving a chair?  Rating(8) Pain is increasing daily. Lower back pain with Bilateral hip radiculopathy. R>L. Pain is also starting to radiate down the front of both legs.  +Driver, -BT, -Dye Allergies.

## 2022-03-20 NOTE — Patient Instructions (Signed)

## 2022-03-27 ENCOUNTER — Ambulatory Visit (INDEPENDENT_AMBULATORY_CARE_PROVIDER_SITE_OTHER): Payer: Medicare Other | Admitting: Internal Medicine

## 2022-03-27 ENCOUNTER — Encounter: Payer: Self-pay | Admitting: Internal Medicine

## 2022-03-27 VITALS — BP 110/80 | HR 69 | Temp 98.5°F | Ht 68.0 in | Wt 180.3 lb

## 2022-03-27 DIAGNOSIS — E782 Mixed hyperlipidemia: Secondary | ICD-10-CM | POA: Diagnosis not present

## 2022-03-27 DIAGNOSIS — Z0001 Encounter for general adult medical examination with abnormal findings: Secondary | ICD-10-CM

## 2022-03-27 DIAGNOSIS — Z0000 Encounter for general adult medical examination without abnormal findings: Secondary | ICD-10-CM

## 2022-03-27 DIAGNOSIS — E890 Postprocedural hypothyroidism: Secondary | ICD-10-CM

## 2022-03-27 DIAGNOSIS — Z780 Asymptomatic menopausal state: Secondary | ICD-10-CM

## 2022-03-27 DIAGNOSIS — H04123 Dry eye syndrome of bilateral lacrimal glands: Secondary | ICD-10-CM | POA: Diagnosis not present

## 2022-03-27 DIAGNOSIS — H9191 Unspecified hearing loss, right ear: Secondary | ICD-10-CM

## 2022-03-27 DIAGNOSIS — Z13820 Encounter for screening for osteoporosis: Secondary | ICD-10-CM

## 2022-03-27 DIAGNOSIS — I10 Essential (primary) hypertension: Secondary | ICD-10-CM | POA: Diagnosis not present

## 2022-03-27 LAB — COMPREHENSIVE METABOLIC PANEL
ALT: 16 U/L (ref 0–35)
AST: 20 U/L (ref 0–37)
Albumin: 4.6 g/dL (ref 3.5–5.2)
Alkaline Phosphatase: 62 U/L (ref 39–117)
BUN: 21 mg/dL (ref 6–23)
CO2: 29 mEq/L (ref 19–32)
Calcium: 9.8 mg/dL (ref 8.4–10.5)
Chloride: 100 mEq/L (ref 96–112)
Creatinine, Ser: 0.74 mg/dL (ref 0.40–1.20)
GFR: 82.53 mL/min (ref >60.00)
Glucose, Bld: 88 mg/dL (ref 70–99)
Potassium: 3.8 mEq/L (ref 3.5–5.1)
Sodium: 136 mEq/L (ref 135–145)
Total Bilirubin: 0.6 mg/dL (ref 0.2–1.2)
Total Protein: 7.4 g/dL (ref 6.0–8.3)

## 2022-03-27 LAB — CBC WITH DIFFERENTIAL/PLATELET
Basophils Absolute: 0 10*3/uL (ref 0.0–0.1)
Basophils Relative: 0.6 % (ref 0.0–3.0)
Eosinophils Absolute: 0.1 10*3/uL (ref 0.0–0.7)
Eosinophils Relative: 2.4 % (ref 0.0–5.0)
HCT: 41.1 % (ref 36.0–46.0)
Hemoglobin: 14 g/dL (ref 12.0–15.0)
Lymphocytes Relative: 32.6 % (ref 12.0–46.0)
Lymphs Abs: 1.6 10*3/uL (ref 0.7–4.0)
MCHC: 34 g/dL (ref 30.0–36.0)
MCV: 86.3 fl (ref 78.0–100.0)
Monocytes Absolute: 0.5 10*3/uL (ref 0.1–1.0)
Monocytes Relative: 11 % (ref 3.0–12.0)
Neutro Abs: 2.7 10*3/uL (ref 1.4–7.7)
Neutrophils Relative %: 53.4 % (ref 43.0–77.0)
Platelets: 239 10*3/uL (ref 150.0–400.0)
RBC: 4.76 Mil/uL (ref 3.87–5.11)
RDW: 13.3 % (ref 11.5–15.5)
WBC: 5 10*3/uL (ref 4.0–10.5)

## 2022-03-27 LAB — LIPID PANEL
Cholesterol: 181 mg/dL (ref 0–200)
HDL: 71.5 mg/dL (ref >39.00)
LDL Cholesterol: 96 mg/dL (ref 0–99)
NonHDL: 109.9
Total CHOL/HDL Ratio: 3
Triglycerides: 71 mg/dL (ref 0.0–149.0)
VLDL: 14.2 mg/dL (ref 0.0–40.0)

## 2022-03-27 LAB — TSH: TSH: 1.78 u[IU]/mL (ref 0.35–5.50)

## 2022-03-27 MED ORDER — CYCLOSPORINE 0.05 % OP EMUL: 1.0000 [drp] | Freq: Two times a day (BID) | OPHTHALMIC | 2 refills | Status: DC
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
## 2022-03-27 NOTE — Progress Notes (Signed)
Sharon Anthony - 71 y.o. female MRN [id number]  Date of birth: [birth year]  Office Visit Note: Visit Date: 03/20/2022 PCP: Philip Aspen, Limmie Patricia, MD Referred by: Philip Aspen, Estel*  Subjective: Chief Complaint  Patient presents with   Lower Back - Pain   HPI:  Sharon Anthony is a 71 y.o. female who comes in today for planned repeat Bilateral L5-S1  Lumbar Transforaminal epidural steroid injection with fluoroscopic guidance.  The patient has failed conservative care including home exercise, medications, time and activity modification.  This injection will be diagnostic and hopefully therapeutic.  Please see requesting physician notes for further details and justification. Patient received more than 50% pain relief from prior injection.   She does have a couple levels of stenosis.  Symptoms are somewhat anterior lateral.  She has done well with the L5 injection however in the past so we will repeat that today.  Consider injection at a more cranial level in the future if needed.  Referring: Dr. Marrianne Mood Dean   ROS Otherwise per HPI.  Assessment & Plan: Visit Diagnoses:    ICD-10-CM   1. Lumbar radiculopathy  M54.16 XR C-ARM NO REPORT    methylPREDNISolone acetate (DEPO-MEDROL) injection 40 mg    XR C-ARM NO REPORT    Epidural Steroid injection    DISCONTINUED: methylPREDNISolone acetate (DEPO-MEDROL) injection 80 mg    CANCELED: Epidural Steroid injection      Plan: No additional findings.   Meds & Orders:  Meds ordered this encounter  Medications   methylPREDNISolone acetate (DEPO-MEDROL) injection 40 mg   DISCONTD: methylPREDNISolone acetate (DEPO-MEDROL) injection 80 mg    Orders Placed This Encounter  Procedures   XR C-ARM NO REPORT   XR C-ARM NO REPORT   Epidural Steroid injection    Follow-up: Return for visit to requesting provider as needed.   Procedures: No procedures performed  Lumbosacral Transforaminal Epidural Steroid Injection - Sub-Pedicular  Approach with Fluoroscopic Guidance  [name]      Date of Birth: Apr 11, 1951 MRN: [id number] PCP: Philip Aspen, Limmie Patricia, MD      Visit Date: 03/20/2022   Universal Protocol:    Date/Time: 03/20/2022  Consent Given By: the patient  Position: PRONE  Additional Comments: Vital signs were monitored before and after the procedure. Patient was prepped and draped in the usual sterile fashion. The correct patient, procedure, and site was verified.   Injection Procedure Details:   Procedure diagnoses: Lumbar radiculopathy [M54.16]    Meds Administered:  Meds ordered this encounter  Medications   methylPREDNISolone acetate (DEPO-MEDROL) injection 40 mg   DISCONTD: methylPREDNISolone acetate (DEPO-MEDROL) injection 80 mg    Laterality: Bilateral  Location/Site: L5  Needle:5.0 in., 22 ga.  Short bevel or Quincke spinal needle  Needle Placement: Transforaminal  Findings:    -Comments: Excellent flow of contrast along the nerve, nerve root and into the epidural space.  Procedure Details: After squaring off the end-plates to get a true AP view, the C-arm was positioned so that an oblique view of the foramen as noted above was visualized. The target area is just inferior to the "nose of the scotty dog" or sub pedicular. The soft tissues overlying this structure were infiltrated with 2-3 ml. of 1% Lidocaine without Epinephrine.  The spinal needle was inserted toward the target using a "trajectory" view along the fluoroscope beam.  Under AP and lateral visualization, the needle was advanced so it did not puncture dura and was located close  the 6 O'Clock position of the pedical in AP tracterory. Biplanar projections were used to confirm position. Aspiration was confirmed to be negative for CSF and/or blood. A 1-2 ml. volume of Isovue-250 was injected and flow of contrast was noted at each level. Radiographs were obtained for documentation purposes.   After attaining the  desired flow of contrast documented above, a 0.5 to 1.0 ml test dose of 0.25% Marcaine was injected into each respective transforaminal space.  The patient was observed for 90 seconds post injection.  After no sensory deficits were reported, and normal lower extremity motor function was noted,   the above injectate was administered so that equal amounts of the injectate were placed at each foramen (level) into the transforaminal epidural space.   Additional Comments:  No complications occurred Dressing: 2 x 2 sterile gauze and Band-Aid    Post-procedure details: Patient was observed during the procedure. Post-procedure instructions were reviewed.  Patient left the clinic in stable condition.    Clinical History: No specialty comments available.     Objective:  VS:  HT:    WT:   BMI:     BP:   HR: bpm  TEMP: ( )  RESP:  Physical Exam Vitals and nursing note reviewed.  Constitutional:      General: She is not in acute distress.    Appearance: Normal appearance. She is well-developed. She is not ill-appearing.  HENT:     Head: Normocephalic and atraumatic.     Right Ear: External ear normal.     Left Ear: External ear normal.  Eyes:     Extraocular Movements: Extraocular movements intact.     Conjunctiva/sclera: Conjunctivae normal.     Pupils: Pupils are equal, round, and reactive to light.  Cardiovascular:     Rate and Rhythm: Normal rate.     Pulses: Normal pulses.  Pulmonary:     Effort: Pulmonary effort is normal. No respiratory distress.  Abdominal:     General: There is no distension.     Palpations: Abdomen is soft.  Musculoskeletal:        General: Tenderness present.     Cervical back: Neck supple.     Right lower leg: No edema.     Left lower leg: No edema.     Comments: Patient has good distal strength with no pain over the greater trochanters.  No clonus or focal weakness.  Skin:    General: Skin is warm and dry.     Findings: No erythema, lesion or  rash.  Neurological:     General: No focal deficit present.     Mental Status: She is alert and oriented to person, place, and time.     Sensory: No sensory deficit.     Motor: No weakness or abnormal muscle tone.     Coordination: Coordination normal.     Gait: Gait normal.  Psychiatric:        Mood and Affect: Mood normal.        Behavior: Behavior normal.      Imaging: No results found.

## 2022-03-27 NOTE — Procedures (Signed)
Lumbosacral Transforaminal Epidural Steroid Injection - Sub-Pedicular Approach with Fluoroscopic Guidance  [name]      Date of Birth: [birth year] MRN: [id number] PCP: Philip Aspen, Limmie Patricia, MD      Visit Date: 03/20/2022   Universal Protocol:    Date/Time: 03/20/2022  Consent Given By: the patient  Position: PRONE  Additional Comments: Vital signs were monitored before and after the procedure. Patient was prepped and draped in the usual sterile fashion. The correct patient, procedure, and site was verified.   Injection Procedure Details:   Procedure diagnoses: Lumbar radiculopathy [M54.16]    Meds Administered:  Meds ordered this encounter  Medications   methylPREDNISolone acetate (DEPO-MEDROL) injection 40 mg   DISCONTD: methylPREDNISolone acetate (DEPO-MEDROL) injection 80 mg    Laterality: Bilateral  Location/Site: L5  Needle:5.0 in., 22 ga.  Short bevel or Quincke spinal needle  Needle Placement: Transforaminal  Findings:    -Comments: Excellent flow of contrast along the nerve, nerve root and into the epidural space.  Procedure Details: After squaring off the end-plates to get a true AP view, the C-arm was positioned so that an oblique view of the foramen as noted above was visualized. The target area is just inferior to the "nose of the scotty dog" or sub pedicular. The soft tissues overlying this structure were infiltrated with 2-3 ml. of 1% Lidocaine without Epinephrine.  The spinal needle was inserted toward the target using a "trajectory" view along the fluoroscope beam.  Under AP and lateral visualization, the needle was advanced so it did not puncture dura and was located close the 6 O'Clock position of the pedical in AP tracterory. Biplanar projections were used to confirm position. Aspiration was confirmed to be negative for CSF and/or blood. A 1-2 ml. volume of Isovue-250 was injected and flow of contrast was noted at each level.  Radiographs were obtained for documentation purposes.   After attaining the desired flow of contrast documented above, a 0.5 to 1.0 ml test dose of 0.25% Marcaine was injected into each respective transforaminal space.  The patient was observed for 90 seconds post injection.  After no sensory deficits were reported, and normal lower extremity motor function was noted,   the above injectate was administered so that equal amounts of the injectate were placed at each foramen (level) into the transforaminal epidural space.   Additional Comments:  No complications occurred Dressing: 2 x 2 sterile gauze and Band-Aid    Post-procedure details: Patient was observed during the procedure. Post-procedure instructions were reviewed.  Patient left the clinic in stable condition.

## 2022-04-08 ENCOUNTER — Telehealth: Payer: Self-pay | Admitting: Internal Medicine

## 2022-04-08 NOTE — Telephone Encounter (Signed)
Heather from [facility] pharmacy call and want to know the  quantity  for this medication restasis and want a call back.

## 2022-04-08 NOTE — Telephone Encounter (Signed)
Informed the pharmacist that Dr Ardyth Harps does not refill this medication.

## 2022-04-09 ENCOUNTER — Encounter: Payer: Self-pay | Admitting: Internal Medicine

## 2022-04-17 ENCOUNTER — Other Ambulatory Visit: Payer: PRIVATE HEALTH INSURANCE

## 2022-04-21 ENCOUNTER — Other Ambulatory Visit: Payer: Self-pay | Admitting: Internal Medicine

## 2022-04-26 ENCOUNTER — Other Ambulatory Visit: Payer: Self-pay | Admitting: Internal Medicine

## 2022-04-26 DIAGNOSIS — G479 Sleep disorder, unspecified: Secondary | ICD-10-CM

## 2022-04-29 NOTE — Telephone Encounter (Signed)
Last OV 03/27/22 Next OV: none scheduled

## 2022-04-30 NOTE — Telephone Encounter (Signed)
Refilled once in Dr. Hardie Shackleton absence  Kristian Covey MD [facility]

## 2022-05-10 ENCOUNTER — Other Ambulatory Visit: Payer: Self-pay | Admitting: Physical Medicine and Rehabilitation

## 2022-05-10 MED ORDER — TRAMADOL HCL 50 MG PO TABS: ORAL_TABLET | ORAL | 0 refills | Status: DC

## 2022-05-13 ENCOUNTER — Other Ambulatory Visit: Payer: Self-pay | Admitting: Internal Medicine

## 2022-05-19 ENCOUNTER — Other Ambulatory Visit: Payer: Self-pay | Admitting: Internal Medicine

## 2022-05-19 DIAGNOSIS — F339 Major depressive disorder, recurrent, unspecified: Secondary | ICD-10-CM

## 2022-05-20 ENCOUNTER — Other Ambulatory Visit: Payer: Self-pay | Admitting: Internal Medicine

## 2022-05-21 ENCOUNTER — Other Ambulatory Visit: Payer: Self-pay | Admitting: Internal Medicine

## 2022-05-30 ENCOUNTER — Other Ambulatory Visit: Payer: Self-pay | Admitting: Internal Medicine

## 2022-05-30 DIAGNOSIS — G479 Sleep disorder, unspecified: Secondary | ICD-10-CM

## 2022-05-30 MED ORDER — TEMAZEPAM 30 MG PO CAPS: 30.0000 mg | ORAL_CAPSULE | Freq: Every day | ORAL | 0 refills | Status: DC

## 2022-06-03 ENCOUNTER — Encounter: Payer: Self-pay | Admitting: Internal Medicine

## 2022-06-03 DIAGNOSIS — R768 Other specified abnormal immunological findings in serum: Secondary | ICD-10-CM

## 2022-06-03 MED ORDER — VALACYCLOVIR HCL 500 MG PO TABS: 500.0000 mg | ORAL_TABLET | Freq: Two times a day (BID) | ORAL | 1 refills | Status: DC

## 2022-06-03 MED ORDER — BUPROPION HCL ER (XL) 150 MG PO TB24: 150.0000 mg | ORAL_TABLET | Freq: Every day | ORAL | 1 refills | Status: DC

## 2022-06-06 ENCOUNTER — Ambulatory Visit: Payer: Medicare Other | Attending: Audiology | Admitting: Audiology

## 2022-06-06 DIAGNOSIS — H9313 Tinnitus, bilateral: Secondary | ICD-10-CM | POA: Insufficient documentation

## 2022-06-06 DIAGNOSIS — H903 Sensorineural hearing loss, bilateral: Secondary | ICD-10-CM | POA: Diagnosis present

## 2022-06-06 NOTE — Procedures (Signed)
Outpatient Audiology and [place], [place]  [id number] [phone]  AUDIOLOGICAL  EVALUATION  NAME: Sharon Anthony     DOB:   1950-07-31      MRN: [id number]                                                                                     DATE: 06/06/2022     REFERENT: Isaac Bliss, Rayford Halsted, MD STATUS: Outpatient DIAGNOSIS: Sensorineural hearing loss, bilateral    History: Sharon Anthony was seen for an audiological evaluation due to decreased hearing and tinnitus. Sharon Anthony reports hearing difficulty for 1+ year and tinnitus occurring for 6 months. Sharon Anthony reports increased difficulty hearing and communicating with her husband. Sharon Anthony reports her tinnitus is severe and constant. Sharon Anthony reports her tinnitus has become very bothersome and has caused problems with her sleeping. Sharon Anthony reports she has made lifestyle changed and taken supplements for the tinnitus with no benefit. Sharon Anthony reports a history of dizziness which she describes as an "imbalance." Sharon Anthony denies otalgia and aural fullness. Sharon Anthony reports she was given hearing aids by her brother. Sharon Anthony reports no benefit from the hearing aids and she currently no longer has them. Sharon Anthony reports she subjectively hears better from the left ear.   Sharon Anthony has been seen at [facility] for cerumen removal and audiological services.  Sharon Anthony was last seen for an audiological evaluation on 04/04/2021 at which time tympanometry showed normal middle ear function in both ears. Audiometric results were consistent with a mild to moderately severe sensorineural hearing loss above 3000 Hz in the left ear and above 1000 Hz in the right ear.   Evaluation:  Otoscopy showed a clear view of the tympanic membranes, bilaterally Tympanometry results were consistent with normal middle ear pressure and normal tympanic membrane mobility (Type A),  bilaterally.  Audiometric testing was completed using Conventional Audiometry techniques with insert earphones and TDH headphones. Test results are consistent in the right ear with normal hearing sensitivity sloping to a moderately-severe sensorineural hearing loss and in the left ear with a mild hearing loss, rising to normal hearing sloping to a moderately-severe sensorineural hearing loss. Sensorineural asymmetry noted at 250-500 Hz worse int he left ear and at 2000 Hz and 4000 Hz worse in the right ear. Speech Recognition Thresholds were obtained at 15 dB HL in the right ear and at 20 dB HL in the left ear. Word Recognition Testing was completed at 70 dB HL and Sharon Anthony scored 100%, bilaterally. Word Recognition testing was then completed at 40 dB HL in the right ear and Sharon Anthony scored 76%. This Word Recognition testing shows Sharon Anthony may have benefit from the use of a hearing aid in the right ear.      Results:  The test results were reviewed with Sharon Anthony. Today's results are consistent in the right ear with normal hearing sensitivity sloping to a moderately-severe sensorineural hearing loss and in the left ear with a mild hearing loss, rising to normal hearing sloping to a moderately-severe sensorineural hearing  loss. Sensorineural asymmetry noted at 250-500 Hz worse int he left ear and at 2000 Hz and 4000 Hz worse in the right ear. Sharon Anthony may have communication difficulty in adverse listening environments. She will benefit from the use of good communication strategies and the use of amplification if motivated. Sharon Anthony will see some benefit from the use of hearing aids but may not see a significant benefit at this time. Sharon Anthony was given information about hearing aids and hearing aid centers in the [place] area and was encouraged to pursue amplification if she is motivated. Sharon Anthony was counseled regarding her tinnitus and given handouts for tinnitus. If tinnitus worsens, then Sharon Anthony was  encouraged to schedule and appointment with the [facility].   Recommendations: 1.   Monitor hearing sensitivity. Return in 2 years for an updated audiological evaluation 2.   Communication Needs Assessment with an Audiologist to discuss amplification if motivated.  3.   Schedule an appointment with the [facility] if tinnitus worsens.    45 minutes spent testing and counseling on results.   If you have any questions please feel free to contact me at [phone].  Anthony Mantis Audiologist, Au.D., CCC-A 06/06/2022  10:41 AM  Cc: Isaac Bliss, Rayford Halsted, MD

## 2022-06-28 ENCOUNTER — Other Ambulatory Visit: Payer: Self-pay | Admitting: Internal Medicine

## 2022-06-28 DIAGNOSIS — G479 Sleep disorder, unspecified: Secondary | ICD-10-CM

## 2022-07-02 MED ORDER — TEMAZEPAM 30 MG PO CAPS: 30.0000 mg | ORAL_CAPSULE | Freq: Every day | ORAL | 0 refills | Status: DC

## 2022-07-05 ENCOUNTER — Encounter: Payer: Self-pay | Admitting: Internal Medicine

## 2022-07-05 MED ORDER — LEVOTHYROXINE SODIUM 137 MCG PO TABS: 137.0000 ug | ORAL_TABLET | Freq: Every day | ORAL | 1 refills | Status: DC

## 2022-07-05 MED ORDER — AMLODIPINE BESYLATE 5 MG PO TABS: 5.0000 mg | ORAL_TABLET | Freq: Every day | ORAL | 0 refills | Status: DC

## 2022-07-25 LAB — HM MAMMOGRAPHY

## 2022-07-31 ENCOUNTER — Other Ambulatory Visit: Payer: Self-pay | Admitting: Adult Health

## 2022-07-31 ENCOUNTER — Other Ambulatory Visit: Payer: Self-pay | Admitting: *Deleted

## 2022-07-31 ENCOUNTER — Other Ambulatory Visit: Payer: Self-pay | Admitting: Internal Medicine

## 2022-07-31 DIAGNOSIS — G479 Sleep disorder, unspecified: Secondary | ICD-10-CM

## 2022-07-31 MED ORDER — TEMAZEPAM 30 MG PO CAPS: 30.0000 mg | ORAL_CAPSULE | Freq: Every day | ORAL | 0 refills | Status: DC

## 2022-08-01 ENCOUNTER — Other Ambulatory Visit: Payer: Self-pay | Admitting: Physical Medicine and Rehabilitation

## 2022-08-01 MED ORDER — TRAMADOL HCL 50 MG PO TABS: ORAL_TABLET | ORAL | 0 refills | Status: DC

## 2022-08-12 ENCOUNTER — Encounter: Payer: Self-pay | Admitting: Internal Medicine

## 2022-08-14 ENCOUNTER — Ambulatory Visit: Payer: PRIVATE HEALTH INSURANCE | Admitting: Family Medicine

## 2022-08-19 ENCOUNTER — Encounter: Payer: Self-pay | Admitting: Internal Medicine

## 2022-08-19 DIAGNOSIS — R768 Other specified abnormal immunological findings in serum: Secondary | ICD-10-CM

## 2022-08-19 MED ORDER — VALACYCLOVIR HCL 500 MG PO TABS
500.0000 mg | ORAL_TABLET | Freq: Two times a day (BID) | ORAL | 1 refills | Status: DC
Start: 2022-08-19 — End: 2022-08-26

## 2022-08-20 ENCOUNTER — Encounter: Payer: Self-pay | Admitting: Internal Medicine

## 2022-08-20 ENCOUNTER — Other Ambulatory Visit: Payer: Self-pay | Admitting: Internal Medicine

## 2022-08-20 ENCOUNTER — Ambulatory Visit: Payer: Medicare Other | Admitting: Internal Medicine

## 2022-08-20 VITALS — BP 124/80 | HR 64 | Temp 98.5°F | Wt 173.7 lb

## 2022-08-20 DIAGNOSIS — B3731 Acute candidiasis of vulva and vagina: Secondary | ICD-10-CM

## 2022-08-20 DIAGNOSIS — M5127 Other intervertebral disc displacement, lumbosacral region: Secondary | ICD-10-CM | POA: Diagnosis not present

## 2022-08-20 DIAGNOSIS — G8929 Other chronic pain: Secondary | ICD-10-CM

## 2022-08-20 DIAGNOSIS — M2550 Pain in unspecified joint: Secondary | ICD-10-CM

## 2022-08-20 DIAGNOSIS — M479 Spondylosis, unspecified: Secondary | ICD-10-CM | POA: Diagnosis not present

## 2022-08-20 DIAGNOSIS — H04123 Dry eye syndrome of bilateral lacrimal glands: Secondary | ICD-10-CM | POA: Diagnosis not present

## 2022-08-20 DIAGNOSIS — M545 Low back pain, unspecified: Secondary | ICD-10-CM

## 2022-08-20 MED ORDER — CYCLOSPORINE 0.05 % OP EMUL: 1.0000 [drp] | Freq: Two times a day (BID) | OPHTHALMIC | 2 refills | Status: DC

## 2022-08-20 MED ORDER — FLUCONAZOLE 150 MG PO TABS
150.0000 mg | ORAL_TABLET | Freq: Once | ORAL | 0 refills | Status: AC
Start: 2022-08-20 — End: 2022-08-20
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
## 2022-08-25 ENCOUNTER — Encounter: Payer: Self-pay | Admitting: Internal Medicine

## 2022-08-25 DIAGNOSIS — R768 Other specified abnormal immunological findings in serum: Secondary | ICD-10-CM

## 2022-08-25 DIAGNOSIS — F339 Major depressive disorder, recurrent, unspecified: Secondary | ICD-10-CM

## 2022-08-26 MED ORDER — LOSARTAN POTASSIUM-HCTZ 50-12.5 MG PO TABS: 1.0000 | ORAL_TABLET | Freq: Every day | ORAL | 1 refills | Status: DC

## 2022-08-26 MED ORDER — BUPROPION HCL ER (XL) 150 MG PO TB24: 150.0000 mg | ORAL_TABLET | Freq: Every day | ORAL | 1 refills | Status: DC

## 2022-08-26 MED ORDER — VALACYCLOVIR HCL 500 MG PO TABS
500.0000 mg | ORAL_TABLET | Freq: Two times a day (BID) | ORAL | 1 refills | Status: DC
Start: 2022-08-26 — End: 2022-10-16

## 2022-08-26 MED ORDER — BUPROPION HCL ER (XL) 300 MG PO TB24
300.0000 mg | ORAL_TABLET | Freq: Every day | ORAL | 1 refills | Status: DC
Start: 2022-08-26 — End: 2023-06-02

## 2022-08-28 ENCOUNTER — Other Ambulatory Visit: Payer: Self-pay | Admitting: Physical Medicine and Rehabilitation

## 2022-09-02 ENCOUNTER — Other Ambulatory Visit: Payer: Self-pay | Admitting: Internal Medicine

## 2022-09-02 DIAGNOSIS — G479 Sleep disorder, unspecified: Secondary | ICD-10-CM

## 2022-09-02 MED ORDER — TEMAZEPAM 30 MG PO CAPS
30.0000 mg | ORAL_CAPSULE | Freq: Every day | ORAL | 0 refills | Status: DC
Start: 2022-09-02 — End: 2022-09-04

## 2022-09-03 ENCOUNTER — Encounter: Payer: Self-pay | Admitting: Internal Medicine

## 2022-09-03 ENCOUNTER — Telehealth: Payer: Self-pay | Admitting: Internal Medicine

## 2022-09-03 ENCOUNTER — Other Ambulatory Visit: Payer: Self-pay | Admitting: Internal Medicine

## 2022-09-03 DIAGNOSIS — G479 Sleep disorder, unspecified: Secondary | ICD-10-CM

## 2022-09-03 NOTE — Telephone Encounter (Signed)
Pt is calling and temazepam (RESTORIL) 30 MG capsule  was printed and not escribe please resend to  [facility] [address] [place] - [address] Phone: [phone]  Fax: [phone]

## 2022-09-03 NOTE — Telephone Encounter (Signed)
Pt called again, and this time she was extremely rude & upset. Pt says she does not know what the problem is, but she has not slept in 4 days and needs her sleeping medication TODAY!!!

## 2022-09-04 ENCOUNTER — Other Ambulatory Visit: Payer: Self-pay | Admitting: Internal Medicine

## 2022-09-04 DIAGNOSIS — G479 Sleep disorder, unspecified: Secondary | ICD-10-CM

## 2022-09-04 MED ORDER — TEMAZEPAM 30 MG PO CAPS: 30.0000 mg | ORAL_CAPSULE | Freq: Every day | ORAL | 0 refills | Status: DC

## 2022-09-06 ENCOUNTER — Encounter: Payer: Self-pay | Admitting: Internal Medicine

## 2022-09-09 ENCOUNTER — Other Ambulatory Visit: Payer: Self-pay | Admitting: Internal Medicine

## 2022-09-09 DIAGNOSIS — B3731 Acute candidiasis of vulva and vagina: Secondary | ICD-10-CM

## 2022-09-09 MED ORDER — FLUCONAZOLE 150 MG PO TABS
150.0000 mg | ORAL_TABLET | Freq: Once | ORAL | 0 refills | Status: DC
Start: 2022-09-09 — End: 2023-02-17

## 2022-09-24 ENCOUNTER — Encounter: Payer: Self-pay | Admitting: Internal Medicine

## 2022-09-24 DIAGNOSIS — Z1211 Encounter for screening for malignant neoplasm of colon: Secondary | ICD-10-CM

## 2022-09-24 DIAGNOSIS — G479 Sleep disorder, unspecified: Secondary | ICD-10-CM

## 2022-09-26 ENCOUNTER — Other Ambulatory Visit: Payer: PRIVATE HEALTH INSURANCE

## 2022-10-01 MED ORDER — TEMAZEPAM 30 MG PO CAPS: 30.0000 mg | ORAL_CAPSULE | Freq: Every day | ORAL | 0 refills | Status: DC

## 2022-10-01 NOTE — Addendum Note (Signed)
Addended by: Kern Reap B on: 10/01/2022 10:17 AM   Modules accepted: Orders

## 2022-10-01 NOTE — Addendum Note (Signed)
Addended by: Kern Reap B on: 10/01/2022 10:36 AM   Modules accepted: Orders

## 2022-10-16 ENCOUNTER — Encounter: Payer: Self-pay | Admitting: Internal Medicine

## 2022-10-16 DIAGNOSIS — R768 Other specified abnormal immunological findings in serum: Secondary | ICD-10-CM

## 2022-10-16 MED ORDER — AMLODIPINE BESYLATE 5 MG PO TABS: 5.0000 mg | ORAL_TABLET | Freq: Every day | ORAL | 1 refills | Status: DC

## 2022-10-16 MED ORDER — VALACYCLOVIR HCL 500 MG PO TABS
500.0000 mg | ORAL_TABLET | Freq: Two times a day (BID) | ORAL | 5 refills | Status: DC
Start: 2022-10-16 — End: 2023-04-23

## 2022-10-17 ENCOUNTER — Encounter: Payer: Self-pay | Admitting: Gastroenterology

## 2022-10-28 ENCOUNTER — Encounter: Payer: Self-pay | Admitting: Physical Medicine and Rehabilitation

## 2022-10-28 ENCOUNTER — Encounter: Payer: Self-pay | Admitting: Internal Medicine

## 2022-10-28 MED ORDER — GABAPENTIN 300 MG PO CAPS: 600.0000 mg | ORAL_CAPSULE | Freq: Three times a day (TID) | ORAL | 3 refills | Status: DC

## 2022-10-29 ENCOUNTER — Other Ambulatory Visit: Payer: Self-pay | Admitting: Internal Medicine

## 2022-10-29 DIAGNOSIS — G479 Sleep disorder, unspecified: Secondary | ICD-10-CM

## 2022-10-29 MED ORDER — TEMAZEPAM 30 MG PO CAPS
30.0000 mg | ORAL_CAPSULE | Freq: Every day | ORAL | 0 refills | Status: DC
Start: 2022-10-29 — End: 2022-11-28

## 2022-11-08 ENCOUNTER — Other Ambulatory Visit: Payer: Self-pay | Admitting: Internal Medicine

## 2022-11-10 ENCOUNTER — Encounter: Payer: Self-pay | Admitting: Internal Medicine

## 2022-11-10 ENCOUNTER — Other Ambulatory Visit: Payer: Self-pay | Admitting: Internal Medicine

## 2022-11-10 DIAGNOSIS — B372 Candidiasis of skin and nail: Secondary | ICD-10-CM

## 2022-11-11 MED ORDER — NYSTATIN 100000 UNIT/GM EX POWD
Freq: Two times a day (BID) | CUTANEOUS | 1 refills | Status: AC
Start: 2022-11-11 — End: ?

## 2022-11-11 MED ORDER — FUROSEMIDE 40 MG PO TABS: 40.0000 mg | ORAL_TABLET | Freq: Every day | ORAL | 1 refills | Status: DC

## 2022-11-25 ENCOUNTER — Other Ambulatory Visit: Payer: Self-pay | Admitting: Internal Medicine

## 2022-11-25 DIAGNOSIS — G479 Sleep disorder, unspecified: Secondary | ICD-10-CM

## 2022-11-27 ENCOUNTER — Other Ambulatory Visit: Payer: Self-pay | Admitting: Internal Medicine

## 2022-11-27 DIAGNOSIS — G479 Sleep disorder, unspecified: Secondary | ICD-10-CM

## 2022-11-28 ENCOUNTER — Encounter: Payer: Self-pay | Admitting: Internal Medicine

## 2022-12-23 ENCOUNTER — Ambulatory Visit (AMBULATORY_SURGERY_CENTER): Payer: Medicare Other | Admitting: *Deleted

## 2022-12-23 VITALS — Ht 68.0 in | Wt 178.0 lb

## 2022-12-23 DIAGNOSIS — Z1211 Encounter for screening for malignant neoplasm of colon: Secondary | ICD-10-CM

## 2022-12-23 MED ORDER — NA SULFATE-K SULFATE-MG SULF 17.5-3.13-1.6 GM/177ML PO SOLN
1.0000 | Freq: Once | ORAL | 0 refills | Status: AC
Start: 2022-12-23 — End: 2022-12-23

## 2022-12-23 NOTE — Progress Notes (Signed)
Pt's name and DOB verified at the beginning of the pre-visit.  Pt denies any difficulty with ambulating,sitting, laying down or rolling side to side Gave both [facility] main # and MD on call # prior to instructions.  No egg or soy allergy known to patient  No issues known to pt with past sedation with any surgeries or procedures Pt denies having issues being intubat Pt has no issues moving head neck or swallowing No FH of Malignant Hyperthermia Pt is not on diet pills Pt is not on home 02  Pt is not on blood thinners  Pt denies issues with constipation  Pt has frequent issues with constipation RN instructed pt to use Miralax per bottles instructions a week before prep days. Pt states they will Pt is not on dialysis Pt denise any abnormal heart rhythms  Pt denies any upcoming cardiac testing Pt encouraged to use to use Singlecare or Goodrx to reduce cost  Patient's chart reviewed by Cathlyn Parsons CNRA prior to pre-visit and patient appropin  Visit in person Pt states weight is 178lb Instructed pt why it is important to and  to call if they have any changes in health or new medications. Directed them to the # given and on instructions.   Pt states they will.  Instructions reviewed with pt and pt states understanding. Instructed to review again prior to procedure. Pt states they will.  Instructions with coupon given to patient and by my chart

## 2022-12-24 ENCOUNTER — Encounter: Payer: Self-pay | Admitting: Internal Medicine

## 2022-12-25 ENCOUNTER — Other Ambulatory Visit: Payer: Self-pay | Admitting: Internal Medicine

## 2022-12-25 DIAGNOSIS — G479 Sleep disorder, unspecified: Secondary | ICD-10-CM

## 2022-12-25 MED ORDER — TEMAZEPAM 30 MG PO CAPS
30.0000 mg | ORAL_CAPSULE | Freq: Every day | ORAL | 2 refills | Status: DC
Start: 2022-12-25 — End: 2023-03-25

## 2022-12-26 ENCOUNTER — Other Ambulatory Visit: Payer: Self-pay | Admitting: Internal Medicine

## 2023-01-07 ENCOUNTER — Encounter: Payer: Self-pay | Admitting: Internal Medicine

## 2023-01-09 ENCOUNTER — Other Ambulatory Visit: Payer: Self-pay | Admitting: Physical Medicine and Rehabilitation

## 2023-01-13 ENCOUNTER — Encounter: Payer: PRIVATE HEALTH INSURANCE | Admitting: Gastroenterology

## 2023-01-23 ENCOUNTER — Encounter: Payer: PRIVATE HEALTH INSURANCE | Admitting: Gastroenterology

## 2023-01-30 ENCOUNTER — Encounter: Payer: Self-pay | Admitting: Gastroenterology

## 2023-01-30 ENCOUNTER — Ambulatory Visit (AMBULATORY_SURGERY_CENTER): Payer: Medicare Other | Admitting: Gastroenterology

## 2023-01-30 VITALS — BP 118/57 | HR 52 | Temp 98.7°F | Resp 10 | Ht 68.0 in | Wt 178.0 lb

## 2023-01-30 DIAGNOSIS — Z86010 Personal history of colonic polyps: Secondary | ICD-10-CM | POA: Diagnosis not present

## 2023-01-30 DIAGNOSIS — D122 Benign neoplasm of ascending colon: Secondary | ICD-10-CM

## 2023-01-30 DIAGNOSIS — Z09 Encounter for follow-up examination after completed treatment for conditions other than malignant neoplasm: Secondary | ICD-10-CM | POA: Diagnosis not present

## 2023-01-30 DIAGNOSIS — Z800 Family history of malignant neoplasm of digestive organs: Secondary | ICD-10-CM

## 2023-01-30 MED ORDER — SODIUM CHLORIDE 0.9 % IV SOLN: 500.0000 mL | INTRAVENOUS | Status: DC

## 2023-01-30 NOTE — Progress Notes (Signed)
Called to room to assist during endoscopic procedure.  Patient ID and intended procedure confirmed with present staff. Received instructions for my participation in the procedure from the performing physician.  

## 2023-01-30 NOTE — Progress Notes (Signed)
History and Physical:  This patient presents for endoscopic testing for: Encounter Diagnosis  Name Primary?   Personal history of colonic polyps Yes   Surveillance colonsocopy today  I reviewed this patient's records from prior GI practice in [place], [place] (Dr. Kandis Fantasia)   (For documentation purposes: Single diminutive TA 08/2011 7-8 diminutive TAs 10/2015 Single diminutive TA 01/2017 3 diminutive TAs 01/2020 - Dr Regino Schultze recommended 3 year recall   Family history of colon cancer - father) _____  Patient denies chronic abdominal pain, rectal bleeding, constipation or diarrhea.   Patient is otherwise without complaints or active issues today.   Past Medical History: Past Medical History:  Diagnosis Date   Allergy    Anemia    Anxiety    Arthritis    Cervicogenic headache 03/05/2019   Concussion with no loss of consciousness 03/05/2019   Depression    Hypertension    Thyroid disease      Past Surgical History: Past Surgical History:  Procedure Laterality Date   ABDOMINAL HYSTERECTOMY     APPENDECTOMY     LUMBAR LAMINECTOMY     L3-4 and L4-5 x 2    Allergies: Allergies  Allergen Reactions   Lisinopril     Other reaction(s): Cough   Prednisone     Other reaction(s): Anxious   Sulfa Antibiotics Other (See Comments)    Other reaction(s): Unspecified    Codeine Other (See Comments)    Other reaction(s): Unspecified    Latex Dermatitis    with prolonged contact only with prolonged contact only   Nabumetone Rash   Nickel Dermatitis    reactions to cheek jewelry reactions to cheek jewelry    Outpatient Meds: Current Outpatient Medications  Medication Sig Dispense Refill   amLODipine (NORVASC) 5 MG tablet Take 1 tablet (5 mg total) by mouth daily. 90 tablet 1   atorvastatin (LIPITOR) 20 MG tablet Take 1 tablet by mouth once daily 90 tablet 1   buPROPion (WELLBUTRIN XL) 150 MG 24 hr tablet Take 1 tablet (150 mg total) by mouth daily. 90 tablet 1   buPROPion  (WELLBUTRIN XL) 300 MG 24 hr tablet Take 1 tablet (300 mg total) by mouth daily. 90 tablet 1   cycloSPORINE (RESTASIS) 0.05 % ophthalmic emulsion Place 1 drop into both eyes 2 (two) times daily. 60 each 2   furosemide (LASIX) 40 MG tablet Take 1 tablet (40 mg total) by mouth daily. 90 tablet 1   gabapentin (NEURONTIN) 300 MG capsule Take 2 capsules (600 mg total) by mouth 3 (three) times daily. 180 capsule 3   levothyroxine (SYNTHROID) 137 MCG tablet TAKE 1 TABLET BY MOUTH DAILY BEFORE BREAKFAST 90 tablet 1   losartan-hydrochlorothiazide (HYZAAR) 50-12.5 MG tablet Take 1 tablet by mouth daily. 90 tablet 1   NON FORMULARY daily.     nystatin powder Apply topically 2 (two) times daily. 60 g 1   Probiotic Product (PROBIOTIC PO) Take by mouth daily.     temazepam (RESTORIL) 30 MG capsule Take 1 capsule (30 mg total) by mouth at bedtime. 30 capsule 2   traMADol (ULTRAM) 50 MG tablet TAKE 1 TABLET BY MOUTH EVERY 12 (TWELVE) HOURS AS NEEDED FOR MODERATE PAIN OR SEVERE PAIN. 30 tablet 0   Lutein 40 MG CAPS Take by mouth.     valACYclovir (VALTREX) 500 MG tablet Take 1 tablet (500 mg total) by mouth 2 (two) times daily. 60 tablet 5   Current Facility-Administered Medications  Medication Dose Route Frequency Provider Last  Rate Last Admin   0.9 %  sodium chloride infusion  500 mL Intravenous Continuous Charlie Pitter III, MD          ___________________________________________________________________ Objective   Exam:  BP (!) 143/74   Pulse 64   Temp 98.7 F (37.1 C)   Ht 5\' 8"  (1.727 m)   Wt 178 lb (80.7 kg)   SpO2 98%   BMI 27.06 kg/m   CV: regular , S1/S2 Resp: clear to auscultation bilaterally, normal RR and effort noted GI: soft, no tenderness, with active bowel sounds.   Assessment: Encounter Diagnosis  Name Primary?   Personal history of colonic polyps Yes     Plan: Colonoscopy   The benefits and risks of the planned procedure were described in detail with the patient  or (when appropriate) their health care proxy.  Risks were outlined as including, but not limited to, bleeding, infection, perforation, adverse medication reaction leading to cardiac or pulmonary decompensation, pancreatitis (if ERCP).  The limitation of incomplete mucosal visualization was also discussed.  No guarantees or warranties were given.  The patient is appropriate for an endoscopic procedure in the ambulatory setting.   - Amada Jupiter, MD

## 2023-01-30 NOTE — Progress Notes (Signed)
Uneventful anesthetic. Report to pacu rn. Vss. Care resumed by rn. 

## 2023-01-30 NOTE — Patient Instructions (Signed)
Thank you for letting us take care of your healthcare needs today. PLease see handouts given to you on Polyps and  Hemorrhoids.    YOU HAD AN ENDOSCOPIC PROCEDURE TODAY AT THE [facility]:   Refer to the procedure report that was given to you for any specific questions about what was found during the examination.  If the procedure report does not answer your questions, please call your gastroenterologist to clarify.  If you requested that your care partner not be given the details of your procedure findings, then the procedure report has been included in a sealed envelope for you to review at your convenience later.  YOU SHOULD EXPECT: Some feelings of bloating in the abdomen. Passage of more gas than usual.  Walking can help get rid of the air that was put into your GI tract during the procedure and reduce the bloating. If you had a lower endoscopy (such as a colonoscopy or flexible sigmoidoscopy) you may notice spotting of blood in your stool or on the toilet paper. If you underwent a bowel prep for your procedure, you may not have a normal bowel movement for a few days.  Please Note:  You might notice some irritation and congestion in your nose or some drainage.  This is from the oxygen used during your procedure.  There is no need for concern and it should clear up in a day or so.  SYMPTOMS TO REPORT IMMEDIATELY:  Following lower endoscopy (colonoscopy or flexible sigmoidoscopy):  Excessive amounts of blood in the stool  Significant tenderness or worsening of abdominal pains  Swelling of the abdomen that is new, acute  Fever of 100F or higher  For urgent or emergent issues, a gastroenterologist can be reached at any hour by calling [phone]. Do not use MyChart messaging for urgent concerns.    DIET:  We do recommend a small meal at first, but then you may proceed to your regular diet.  Drink plenty of fluids but you should avoid alcoholic beverages for 24  hours.  ACTIVITY:  You should plan to take it easy for the rest of today and you should NOT DRIVE or use heavy machinery until tomorrow (because of the sedation medicines used during the test).    FOLLOW UP: Our staff will call the number listed on your records the next business day following your procedure.  We will call around 7:15- 8:00 am to check on you and address any questions or concerns that you may have regarding the information given to you following your procedure. If we do not reach you, we will leave a message.     If any biopsies were taken you will be contacted by phone or by letter within the next 1-3 weeks.  Please call us at [phone] if you have not heard about the biopsies in 3 weeks.    SIGNATURES/CONFIDENTIALITY: You and/or your care partner have signed paperwork which will be entered into your electronic medical record.  These signatures attest to the fact that that the information above on your After Visit Summary has been reviewed and is understood.  Full responsibility of the confidentiality of this discharge information lies with you and/or your care-partner.

## 2023-01-30 NOTE — Op Note (Signed)
[facility] Patient Name: Sharon Anthony Procedure Date: 01/30/2023 8:32 AM MRN: [id number] Endoscopist: Sherilyn Cooter L. Myrtie Neither , MD, [id number] Age: 72 Referring MD:  Date of Birth: 06-Mar-1951 Gender: Female Account #: [id number] Procedure:                Colonoscopy Indications:              Colon cancer screening in patient at increased                            risk: Colorectal cancer in father, Surveillance:                            Personal history of adenomatous polyps on last                            colonoscopy 3 years ago                           Previous colonoscop[place]state:                           Single diminutive tubular adenoma April 2013, 7-8                            diminutive tubular adenomas June 2017, single                            diminutive tubular adenoma September 2019, 3                            diminutive tubular adenomas September 2021 Medicines:                Monitored Anesthesia Care Procedure:                Pre-Anesthesia Assessment:                           - Prior to the procedure, a History and Physical                            was performed, and patient medications and                            allergies were reviewed. The patient's tolerance of                            previous anesthesia was also reviewed. The risks                            and benefits of the procedure and the sedation                            options and risks were discussed with the patient.  All questions were answered, and informed consent                            was obtained. Prior Anticoagulants: The patient has                            taken no anticoagulant or antiplatelet agents. ASA                            Grade Assessment: II - A patient with mild systemic                            disease. After reviewing the risks and benefits,                            the patient was deemed in satisfactory  condition to                            undergo the procedure.                           After obtaining informed consent, the colonoscope                            was passed under direct vision. Throughout the                            procedure, the patient's blood pressure, pulse, and                            oxygen saturations were monitored continuously. The                            CF [id number] #[id number] was introduced through the anus                            and advanced to the the terminal ileum, with                            identification of the appendiceal orifice and IC                            valve. The colonoscopy was somewhat difficult due                            to a redundant and tortuous sigmoid colon.                            Successful completion of the procedure was aided by                            straightening and shortening the scope to obtain  bowel loop reduction. The patient tolerated the                            procedure well. The quality of the bowel                            preparation was excellent. The terminal ileum,                            ileocecal valve, appendiceal orifice, and rectum                            were photographed. Scope In: 8:42:50 AM Scope Out: 8:58:38 AM Scope Withdrawal Time: 0 hours 12 minutes 15 seconds  Total Procedure Duration: 0 hours 15 minutes 48 seconds  Findings:                 The perianal and digital rectal examinations were                            normal.                           The terminal ileum appeared normal.                           Repeat examination of right colon under NBI                            performed.                           A diminutive polyp was found in the proximal                            ascending colon. The polyp was sessile. The polyp                            was removed with a cold snare. Resection and                             retrieval were complete.                           Internal hemorrhoids were found. The hemorrhoids                            were small.                           The exam was otherwise without abnormality on                            direct and retroflexion views. Complications:            No immediate complications. Estimated Blood Loss:     Estimated blood loss was minimal. Impression:               -  The examined portion of the ileum was normal.                           - One diminutive polyp in the proximal ascending                            colon, removed with a cold snare. Resected and                            retrieved.                           - Internal hemorrhoids.                           - The examination was otherwise normal on direct                            and retroflexion views. Recommendation:           - Patient has a contact number available for                            emergencies. The signs and symptoms of potential                            delayed complications were discussed with the                            patient. Return to normal activities tomorrow.                            Written discharge instructions were provided to the                            patient.                           - Resume previous diet.                           - Continue present medications.                           - Await pathology results.                           - Repeat colonoscopy is recommended for                            surveillance. The colonoscopy date will be                            determined after pathology results from today's                            exam become available for review. (3  to 5 years if                            adenomatous) Sherilyn Cooter L. Myrtie Neither, MD 01/30/2023 9:04:22 AM This report has been signed electronically.

## 2023-01-31 ENCOUNTER — Telehealth: Payer: Self-pay

## 2023-01-31 NOTE — Telephone Encounter (Signed)
Follow up call to pt, no answer. 

## 2023-02-03 LAB — SURGICAL PATHOLOGY

## 2023-02-04 ENCOUNTER — Encounter: Payer: Self-pay | Admitting: Gastroenterology

## 2023-02-13 ENCOUNTER — Encounter: Payer: Self-pay | Admitting: Internal Medicine

## 2023-02-13 ENCOUNTER — Other Ambulatory Visit: Payer: Self-pay | Admitting: Internal Medicine

## 2023-02-13 DIAGNOSIS — B3731 Acute candidiasis of vulva and vagina: Secondary | ICD-10-CM

## 2023-02-14 ENCOUNTER — Telehealth: Payer: Self-pay | Admitting: Orthopedic Surgery

## 2023-02-14 NOTE — Telephone Encounter (Signed)
Patient called stating she needs a refill on tramadol. Sayler Mickiewicz [phone]

## 2023-02-17 ENCOUNTER — Encounter: Payer: Self-pay | Admitting: Internal Medicine

## 2023-02-18 NOTE — Telephone Encounter (Signed)
Spoke with patient and scheduled OV for 02/19/23.

## 2023-02-19 ENCOUNTER — Ambulatory Visit: Payer: Medicare Other | Admitting: Physical Medicine and Rehabilitation

## 2023-02-19 ENCOUNTER — Encounter: Payer: Self-pay | Admitting: Physical Medicine and Rehabilitation

## 2023-02-19 ENCOUNTER — Other Ambulatory Visit (INDEPENDENT_AMBULATORY_CARE_PROVIDER_SITE_OTHER): Payer: Medicare Other

## 2023-02-19 VITALS — BP 128/81 | HR 132

## 2023-02-19 DIAGNOSIS — M533 Sacrococcygeal disorders, not elsewhere classified: Secondary | ICD-10-CM | POA: Diagnosis not present

## 2023-02-19 DIAGNOSIS — M545 Low back pain, unspecified: Secondary | ICD-10-CM | POA: Diagnosis not present

## 2023-02-19 DIAGNOSIS — R0781 Pleurodynia: Secondary | ICD-10-CM | POA: Diagnosis not present

## 2023-02-19 DIAGNOSIS — G8929 Other chronic pain: Secondary | ICD-10-CM | POA: Diagnosis not present

## 2023-02-19 MED ORDER — TRAMADOL HCL 50 MG PO TABS: ORAL_TABLET | ORAL | 0 refills | Status: DC

## 2023-02-19 NOTE — Progress Notes (Unsigned)
Functional Pain Scale - descriptive words and definitions   Severe (9)  Cannot do any ADL's even with assistance can barely talk/unable to sleep and unable to use distraction. Severe range order  Average Pain 8  LBP, fell two weeks ago into bathtub. Having pain in left hip and tail bone. Worse with driving.

## 2023-02-19 NOTE — Progress Notes (Unsigned)
Sharon Anthony - 72 y.o. female MRN [id number]  Date of birth: January 03, 1951  Office Visit Note: Visit Date: 02/19/2023 PCP: Sharon Anthony, Sharon Patricia, MD Referred by: Sharon Anthony, Sharon Anthony*  Subjective: Chief Complaint  Patient presents with   Lower Back - Pain   Left Hip - Pain   HPI: Sharon Anthony is a 72 y.o. female who comes in today for evaluation of 2 separate issues: acute coccyx and left rib pain and chronic bilateral lower back pain. Patient fell in bathroom about 2 weeks ago. She reports pain to coccyx and left rib pain, becomes worse with movement, activity and deep breathing. She describes pain as sore and aching, currently rates as 7 out of 10. Some relief of rest, heating pad and medications. She feels fall exacerbated her overall pain and wanted to come in and be seen to discuss options and pain management.  Chronic bilateral lower back pain, intermittent radiation of pain down legs. Overall, she has been doing well managing lower back pain until fall 2 weeks ago. She denies radicular symptoms today but is concerned she needs to schedule lumbar epidural steroid injection. Lower back pain becomes worse with movement and activity. Some relief of pain with rest, lumbar brace and medications. Lumbar MRI imaging from 2022 exhibits bilateral laminectomy at L3-L4, bilateral laminectomy at L4-L5 with 7mm anterolisthesis and moderate to severe multi-factorial stenosis bilaterally at this same level. She has undergone several lumbar epidural steroid injections in our office, most recent was bilateral L5 transforaminal epidural steroid injection on 03/20/2022. Historically, she reports good intermittent relief of pain with these procedures. Patient is requesting new lumbar brace today. She denies focal weakness, numbness and tingling   Review of Systems  Musculoskeletal:  Positive for back pain.       Left rib/coccyx pain from fall 2 weeks ago.   Neurological:  Negative for tingling,  sensory change, focal weakness and weakness.  All other systems reviewed and are negative.  Otherwise per HPI.  Assessment & Plan: Visit Diagnoses:    ICD-10-CM   1. Coccyx pain  M53.3     2. Rib pain on left side  R07.81 XR Chest 2 View    3. Chronic bilateral low back pain without sciatica  M54.50    G89.29        Plan: Findings:  1. Chronic bilateral lower back pain, intermittent radicular symptoms down the legs. Lower back pain is not severe at this time but has increased post fall. I do feel her symptoms are consistent with lumbar myofascial strain. We discussed treatment plan in detail today, would not recommend performing interventional spine injections at this time as her pain is not severe and is likely more myofascial. I informed her lumbar strain injuries can take 6-8 weeks to heal. I encouraged her to remain active as tolerated. I refilled Tramadol for her to take as needed. I also contacted Sharon Anthony with Sharon Anthony to arrange for patient to have new lumbar brace.   2. Acute coccyx and left sided rib pain post mechanical fall. Patients clinical presentation and exam are consistent with coccygeal pain/strain and left rib contusion. I obtained 2 view chest x-ray today that does not show any radiological evidence of rib fractures and pneumothorax. I encouraged patient use donut cushion when sitting and driving to help with coccyx pain. I also informed her that these type of injuries can take several weeks to heal. She can take Tramadol for moderate/severe pain. No red flag symptoms  noted upon exam today.     Meds & Orders:  Meds ordered this encounter  Medications   traMADol (ULTRAM) 50 MG tablet    Sig: TAKE 1 TABLET BY MOUTH EVERY 12 (TWELVE) HOURS AS NEEDED FOR MODERATE PAIN OR SEVERE PAIN.    Dispense:  30 tablet    Refill:  0    Not to exceed 5 additional fills before 11/28/2021    Orders Placed This Encounter  Procedures   XR Chest 2 View    Follow-up: Return if symptoms  worsen or fail to improve.   Procedures: No procedures performed      Clinical History: No specialty comments available.   She reports that she quit smoking about 26 years ago. Her smoking use included cigarettes. She has never used smokeless tobacco. No results for input(s): "HGBA1C", "LABURIC" in the last 8760 hours.  Objective:  VS:  HT:    WT:   BMI:     BP:128/81  HR:(!) 132bpm  TEMP: ( )  RESP:  Physical Exam Vitals and nursing note reviewed.  HENT:     Head: Normocephalic and atraumatic.     Right Ear: External ear normal.     Left Ear: External ear normal.     Nose: Nose normal.     Mouth/Throat:     Mouth: Mucous membranes are moist.  Eyes:     Extraocular Movements: Extraocular movements intact.  Cardiovascular:     Rate and Rhythm: Normal rate.     Pulses: Normal pulses.  Pulmonary:     Effort: Pulmonary effort is normal.  Abdominal:     General: Abdomen is flat. There is no distension.  Musculoskeletal:        General: Tenderness present.     Cervical back: Normal range of motion.     Comments: Tenderness noted to sacrococcygeal joint upon palpation. No swelling or masses noted to coccyx.  Patient rises from seated position to standing without difficulty. Good lumbar range of motion. No pain noted with facet loading. 5/5 strength noted with bilateral hip flexion, knee flexion/extension, ankle dorsiflexion/plantarflexion and EHL. No clonus noted bilaterally. No pain upon palpation of greater trochanters. No pain with internal/external rotation of bilateral hips. Sensation intact bilaterally. Tenderness noted to bilateral lumbar paraspinal regions. Negative slump test bilaterally. Ambulates without aid, gait steady.      Skin:    General: Skin is warm and dry.     Capillary Refill: Capillary refill takes less than 2 seconds.  Neurological:     General: No focal deficit present.     Mental Status: She is alert and oriented to person, place, and time.   Psychiatric:        Mood and Affect: Mood normal.        Behavior: Behavior normal.     Ortho Exam  Imaging: XR Chest 2 View  Result Date: 02/19/2023 AP and lateral radiographs of chest does not show radiographically apparent displaced rib fractures. No pneumothorax.    Past Medical/Family/Surgical/Social History: Medications & Allergies reviewed per EMR, new medications updated. 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   Encounter for other specified surgical aftercare 03/18/2014   Acquired hallux valgus 01/03/2014   Past Medical History:  Diagnosis Date   Allergy    Anemia    Anxiety    Arthritis    Cervicogenic headache 03/05/2019   Concussion with no loss of consciousness 03/05/2019   Depression    Hypertension    Thyroid disease    Family History  Problem Relation Age of Onset   Transient ischemic attack Mother    Dementia Mother    Rectal cancer Father    Colon cancer Father    Cancer Father    Colon polyps Brother    Psoriasis Brother    Parkinson's disease Maternal Uncle    Stomach cancer Other    Arthritis Other    High blood pressure Other        "everybody"   Stomach cancer Other    Neuropathy Neg Hx    Esophageal cancer Neg Hx    Past Surgical History:  Procedure Laterality Date   ABDOMINAL HYSTERECTOMY     APPENDECTOMY     LUMBAR LAMINECTOMY     L3-4 and L4-5 x 2   Social History   Occupational History   Not on file  Tobacco Use   Smoking  status: Former    Current packs/day: 0.00    Types: Cigarettes    Quit date: 1998    Years since quitting: 26.8   Smokeless tobacco: Never  Vaping Use   Vaping status: Never Used  Substance and Sexual Activity   Alcohol use: Yes    Alcohol/week: 7.0 standard drinks of alcohol    Types: 7 Standard drinks or equivalent per week    Comment: vodka, sometimes wine    Drug use: Never   Sexual activity: Not on file

## 2023-03-10 ENCOUNTER — Ambulatory Visit (INDEPENDENT_AMBULATORY_CARE_PROVIDER_SITE_OTHER): Payer: Medicare Other | Admitting: Internal Medicine

## 2023-03-10 ENCOUNTER — Encounter: Payer: Self-pay | Admitting: Internal Medicine

## 2023-03-10 VITALS — BP 130/84 | HR 68 | Temp 98.2°F | Wt 178.0 lb

## 2023-03-10 DIAGNOSIS — F339 Major depressive disorder, recurrent, unspecified: Secondary | ICD-10-CM

## 2023-03-10 MED ORDER — SERTRALINE HCL 50 MG PO TABS
50.0000 mg | ORAL_TABLET | Freq: Every day | ORAL | 1 refills | Status: DC
Start: 2023-03-10 — End: 2023-06-02

## 2023-03-10 NOTE — Progress Notes (Signed)
Established Patient Office Visit     CC/Reason for Visit: Increased depression  HPI: Sharon Anthony is a 72 y.o. female who is coming in today for the above mentioned reasons.  She has noticed her depression is getting worse.  She has been very tearful and is having issues coping with situations that she is used to coping with in the past.  She is on Wellbutrin 150 mg long-term.  Past Medical/Surgical History: Past Medical History:  Diagnosis Date   Allergy    Anemia    Anxiety    Arthritis    Cervicogenic headache 03/05/2019   Concussion with no loss of consciousness 03/05/2019   Depression    Hypertension    Thyroid disease     Past Surgical History:  Procedure Laterality Date   ABDOMINAL HYSTERECTOMY     APPENDECTOMY     LUMBAR LAMINECTOMY     L3-4 and L4-5 x 2    Social History:  reports that she quit smoking about 26 years ago. Her smoking use included cigarettes. She has never used smokeless tobacco. She reports current alcohol use of about 7.0 standard drinks of alcohol per week. She reports that she does not use drugs.  Allergies: Allergies  Allergen Reactions   Lisinopril     Other reaction(s): Cough   Prednisone     Other reaction(s): Anxious   Sulfa Antibiotics Other (See Comments)    Other reaction(s): Unspecified    Codeine Other (See Comments)    Other reaction(s): Unspecified    Latex Dermatitis    with prolonged contact only with prolonged contact only   Nabumetone Rash   Nickel Dermatitis    reactions to cheek jewelry reactions to cheek jewelry    Family History:  Family History  Problem Relation Age of Onset   Transient ischemic attack Mother    Dementia Mother    Rectal cancer Father    Colon cancer Father    Cancer Father    Colon polyps Brother    Psoriasis Brother    Parkinson's disease Maternal Uncle    Stomach cancer Other    Arthritis Other    High blood pressure Other        "everybody"   Stomach cancer Other     Neuropathy Neg Hx    Esophageal cancer Neg Hx      Current Outpatient Medications:    amLODipine (NORVASC) 5 MG tablet, Take 1 tablet (5 mg total) by mouth daily., Disp: 90 tablet, Rfl: 1   atorvastatin (LIPITOR) 20 MG tablet, Take 1 tablet by mouth once daily, Disp: 90 tablet, Rfl: 1   buPROPion (WELLBUTRIN XL) 150 MG 24 hr tablet, Take 1 tablet (150 mg total) by mouth daily., Disp: 90 tablet, Rfl: 1   buPROPion (WELLBUTRIN XL) 300 MG 24 hr tablet, Take 1 tablet (300 mg total) by mouth daily., Disp: 90 tablet, Rfl: 1   cycloSPORINE (RESTASIS) 0.05 % ophthalmic emulsion, Place 1 drop into both eyes 2 (two) times daily., Disp: 60 each, Rfl: 2   furosemide (LASIX) 40 MG tablet, Take 1 tablet (40 mg total) by mouth daily., Disp: 90 tablet, Rfl: 1   gabapentin (NEURONTIN) 300 MG capsule, Take 2 capsules (600 mg total) by mouth 3 (three) times daily., Disp: 180 capsule, Rfl: 3   levothyroxine (SYNTHROID) 137 MCG tablet, TAKE 1 TABLET BY MOUTH DAILY BEFORE BREAKFAST, Disp: 90 tablet, Rfl: 1   losartan-hydrochlorothiazide (HYZAAR) 50-12.5 MG tablet, Take 1 tablet by mouth  daily., Disp: 90 tablet, Rfl: 1   Lutein 40 MG CAPS, Take by mouth., Disp: , Rfl:    NON FORMULARY, daily., Disp: , Rfl:    nystatin powder, Apply topically 2 (two) times daily., Disp: 60 g, Rfl: 1   Probiotic Product (PROBIOTIC PO), Take by mouth daily., Disp: , Rfl:    sertraline (ZOLOFT) 50 MG tablet, Take 1 tablet (50 mg total) by mouth daily., Disp: 90 tablet, Rfl: 1   temazepam (RESTORIL) 30 MG capsule, Take 1 capsule (30 mg total) by mouth at bedtime., Disp: 30 capsule, Rfl: 2   traMADol (ULTRAM) 50 MG tablet, TAKE 1 TABLET BY MOUTH EVERY 12 (TWELVE) HOURS AS NEEDED FOR MODERATE PAIN OR SEVERE PAIN., Disp: 30 tablet, Rfl: 0   valACYclovir (VALTREX) 500 MG tablet, Take 1 tablet (500 mg total) by mouth 2 (two) times daily., Disp: 60 tablet, Rfl: 5  Review of Systems:  Negative unless indicated in HPI.   Physical  Exam: Vitals:   03/10/23 1541  BP: 130/84  Pulse: 68  Temp: 98.2 F (36.8 C)  TempSrc: Oral  SpO2: 98%  Weight: 178 lb (80.7 kg)    Body mass index is 27.06 kg/m.   Physical Exam Vitals reviewed.  Constitutional:      Appearance: Normal appearance.  HENT:     Head: Normocephalic and atraumatic.  Eyes:     Conjunctiva/sclera: Conjunctivae normal.     Pupils: Pupils are equal, round, and reactive to light.  Skin:    General: Skin is warm and dry.  Neurological:     General: No focal deficit present.     Mental Status: She is alert and oriented to person, place, and time.  Psychiatric:        Mood and Affect: Mood normal.        Behavior: Behavior normal.        Thought Content: Thought content normal.        Judgment: Judgment normal.     Flowsheet Row Office Visit from 03/10/2023 in [facility] [place]  PHQ-9 Total Score 12       Impression and Plan:  Depression, recurrent (HCC) -     Sertraline HCl; Take 1 tablet (50 mg total) by mouth daily.  Dispense: 90 tablet; Refill: 1  -Well-controlled on Wellbutrin.  Add sertraline 50 mg daily.  I have also given her resources to schedule CBT.   Time spent:31 minutes reviewing chart, interviewing and examining patient and formulating plan of care.     Chaya Jan, MD [facility]

## 2023-03-25 ENCOUNTER — Other Ambulatory Visit: Payer: Self-pay | Admitting: Internal Medicine

## 2023-03-25 DIAGNOSIS — G479 Sleep disorder, unspecified: Secondary | ICD-10-CM

## 2023-04-23 ENCOUNTER — Encounter: Payer: Self-pay | Admitting: Internal Medicine

## 2023-04-23 ENCOUNTER — Other Ambulatory Visit: Payer: Self-pay | Admitting: Internal Medicine

## 2023-04-23 DIAGNOSIS — R768 Other specified abnormal immunological findings in serum: Secondary | ICD-10-CM

## 2023-04-23 DIAGNOSIS — G479 Sleep disorder, unspecified: Secondary | ICD-10-CM

## 2023-04-23 MED ORDER — TEMAZEPAM 30 MG PO CAPS
30.0000 mg | ORAL_CAPSULE | Freq: Every day | ORAL | 0 refills | Status: DC
Start: 2023-04-23 — End: 2023-05-20

## 2023-04-23 MED ORDER — VALACYCLOVIR HCL 500 MG PO TABS
500.0000 mg | ORAL_TABLET | Freq: Two times a day (BID) | ORAL | 5 refills | Status: DC
Start: 2023-04-23 — End: 2023-06-23

## 2023-04-24 ENCOUNTER — Other Ambulatory Visit: Payer: Self-pay | Admitting: Physical Medicine and Rehabilitation

## 2023-04-24 MED ORDER — TRAMADOL HCL 50 MG PO TABS: ORAL_TABLET | ORAL | 0 refills | Status: AC

## 2023-04-28 ENCOUNTER — Ambulatory Visit: Payer: Medicare Other

## 2023-04-28 ENCOUNTER — Encounter: Payer: Self-pay | Admitting: Internal Medicine

## 2023-04-28 VITALS — Ht 67.0 in | Wt 178.0 lb

## 2023-04-28 DIAGNOSIS — Z0000 Encounter for general adult medical examination without abnormal findings: Secondary | ICD-10-CM | POA: Diagnosis not present

## 2023-04-28 NOTE — Patient Instructions (Addendum)
Sharon Anthony , Thank you for taking time to come for your Medicare Wellness Visit. I appreciate your ongoing commitment to your health goals. Please review the following plan we discussed and let me know if I can assist you in the future.   Referrals/Orders/Follow-Ups/Clinician Recommendations:   This is a list of the screening recommended for you and due dates:  Health Maintenance  Topic Date Due   Hepatitis C Screening  Never done   COVID-19 Vaccine (7 - 2024-25 season) 01/05/2023   Medicare Annual Wellness Visit  04/27/2024   Mammogram  07/24/2024   Colon Cancer Screening  01/30/2028   DTaP/Tdap/Td vaccine (2 - Td or Tdap) 02/24/2029   Pneumonia Vaccine  Completed   Flu Shot  Completed   DEXA scan (bone density measurement)  Completed   Zoster (Shingles) Vaccine  Completed   HPV Vaccine  Aged Out   Opioid Pain Medicine Management Opioids are powerful medicines that are used to treat moderate to severe pain. When used for short periods of time, they can help you to: Sleep better. Do better in physical or occupational therapy. Feel better in the first few days after an injury. Recover from surgery. Opioids should be taken with the supervision of a trained health care provider. They should be taken for the shortest period of time possible. This is because opioids can be addictive, and the longer you take opioids, the greater your risk of addiction. This addiction can also be called opioid use disorder. What are the risks? Using opioid pain medicines for longer than 3 days increases your risk of side effects. Side effects include: Constipation. Nausea and vomiting. Breathing difficulties (respiratory depression). Drowsiness. Confusion. Opioid use disorder. Itching. Taking opioid pain medicine for a long period of time can affect your ability to do daily tasks. It also puts you at risk for: Motor vehicle crashes. Depression. Suicide. Heart attack. Overdose, which can be  life-threatening. What is a pain treatment plan? A pain treatment plan is an agreement between you and your health care provider. Pain is unique to each person, and treatments vary depending on your condition. To manage your pain, you and your health care provider need to work together. To help you do this: Discuss the goals of your treatment, including how much pain you might expect to have and how you will manage the pain. Review the risks and benefits of taking opioid medicines. Remember that a good treatment plan uses more than one approach and minimizes the chance of side effects. Be honest about the amount of medicines you take and about any drug or alcohol use. Get pain medicine prescriptions from only one health care provider. Pain can be managed with many types of alternative treatments. Ask your health care provider to refer you to one or more specialists who can help you manage pain through: Physical or occupational therapy. Counseling (cognitive behavioral therapy). Good nutrition. Biofeedback. Massage. Meditation. Non-opioid medicine. Following a gentle exercise program. How to use opioid pain medicine Taking medicine Take your pain medicine exactly as told by your health care provider. Take it only when you need it. If your pain gets less severe, you may take less than your prescribed dose if your health care provider approves. If you are not having pain, do nottake pain medicine unless your health care provider tells you to take it. If your pain is severe, do nottry to treat it yourself by taking more pills than instructed on your prescription. Contact your health care provider for  help. Write down the times when you take your pain medicine. It is easy to become confused while on pain medicine. Writing the time can help you avoid overdose. Take other over-the-counter or prescription medicines only as told by your health care provider. Keeping yourself and others safe  While  you are taking opioid pain medicine: Do not drive, use machinery, or power tools. Do not sign legal documents. Do not drink alcohol. Do not take sleeping pills. Do not supervise children by yourself. Do not do activities that require climbing or being in high places. Do not go to a lake, river, ocean, spa, or swimming pool. Do not share your pain medicine with anyone. Keep pain medicine in a locked cabinet or in a secure area where pets and children cannot reach it. Stopping your use of opioids If you have been taking opioid medicine for more than a few weeks, you may need to slowly decrease (taper) how much you take until you stop completely. Tapering your use of opioids can decrease your risk of symptoms of withdrawal, such as: Pain and cramping in the abdomen. Nausea. Sweating. Sleepiness. Restlessness. Uncontrollable shaking (tremors). Cravings for the medicine. Do not attempt to taper your use of opioids on your own. Talk with your health care provider about how to do this. Your health care provider may prescribe a step-down schedule based on how much medicine you are taking and how long you have been taking it. Getting rid of leftover pills Do not save any leftover pills. Get rid of leftover pills safely by: Taking the medicine to a prescription take-back program. This is usually offered by the county or law enforcement. Bringing them to a pharmacy that has a drug disposal container. Flushing them down the toilet. Check the label or package insert of your medicine to see whether this is safe to do. Throwing them out in the trash. Check the label or package insert of your medicine to see whether this is safe to do. If it is safe to throw it out, remove the medicine from the original container, put it into a sealable bag or container, and mix it with used coffee grounds, food scraps, dirt, or cat litter before putting it in the trash. Follow these instructions at home: Activity Do  exercises as told by your health care provider. Avoid activities that make your pain worse. Return to your normal activities as told by your health care provider. Ask your health care provider what activities are safe for you. General instructions You may need to take these actions to prevent or treat constipation: Drink enough fluid to keep your urine pale yellow. Take over-the-counter or prescription medicines. Eat foods that are high in fiber, such as beans, whole grains, and fresh fruits and vegetables. Limit foods that are high in fat and processed sugars, such as fried or sweet foods. Keep all follow-up visits. This is important. Where to find support If you have been taking opioids for a long time, you may benefit from receiving support for quitting from a local support group or counselor. Ask your health care provider for a referral to these resources in your area. Where to find more information Centers for Disease Control and Prevention (CDC): FootballExhibition.com.br [place] Food and Drug Administration (FDA): PumpkinSearch.com.ee Get help right away if: You may have taken too much of an opioid (overdosed). Common symptoms of an overdose: Your breathing is slower or more shallow than normal. You have a very slow heartbeat (pulse). You have slurred speech.  You have nausea and vomiting. Your pupils become very small. You have other potential symptoms: You are very confused. You faint or feel like you will faint. You have cold, clammy skin. You have blue lips or fingernails. You have thoughts of harming yourself or harming others. These symptoms may represent a serious problem that is an emergency. Do not wait to see if the symptoms will go away. Get medical help right away. Call your local emergency services (911 in the [place] Do not drive yourself to the hospital.  If you ever feel like you may hurt yourself or others, or have thoughts about taking your own life, get help right away. Go to your nearest  emergency department or: Call your local emergency services (911 in the [place] Call the [facility] (717 517 6306 in the [place] Call a suicide crisis helpline, such as the National Suicide Prevention Lifeline at (303)749-4200 or 988 in the [place] This is open 24 hours a day in the [place] Text the Crisis Text Line at (530) 524-9043 (in the [place] Summary Opioid medicines can help you manage moderate to severe pain for a short period of time. A pain treatment plan is an agreement between you and your health care provider. Discuss the goals of your treatment, including how much pain you might expect to have and how you will manage the pain. If you think that you or someone else may have taken too much of an opioid, get medical help right away. This information is not intended to replace advice given to you by your health care provider. Make sure you discuss any questions you have with your health care provider. Document Revised: 11/15/2020 Document Reviewed: 08/02/2020 Elsevier Patient Education  2024 [employer]  Advanced directives: (Declined) Advance directive discussed with you today. Even though you declined this today, please call our office should you change your mind, and we can give you the proper paperwork for you to fill out.  Next Medicare Annual Wellness Visit scheduled for next year: Yes

## 2023-04-28 NOTE — Progress Notes (Signed)
Subjective:   Sharon Anthony is a 72 y.o. female who presents for Medicare Annual (Subsequent) preventive examination.  Visit Complete: Virtual I connected with  [name] on 04/28/23 by a audio enabled telemedicine application and verified that I am speaking with the correct person using two identifiers.  Patient Location: Home  Provider Location: Home Office  I discussed the limitations of evaluation and management by telemedicine. The patient expressed understanding and agreed to proceed.  Vital Signs: Because this visit was a virtual/telehealth visit, some criteria may be missing or patient reported. Any vitals not documented were not able to be obtained and vitals that have been documented are patient reported.    Cardiac Risk Factors include: advanced age (>13men, >10 women)     Objective:    Today's Vitals   04/28/23 1551  Weight: 178 lb (80.7 kg)  Height: 5\' 7"  (1.702 m)   Body mass index is 27.88 kg/m.     04/28/2023    3:59 PM 02/18/2021    9:38 AM 01/22/2021   10:18 AM  Advanced Directives  Does Patient Have a Medical Advance Directive? No No No  Would patient like information on creating a medical advance directive? No - Patient declined  No - Patient declined    Current Medications (verified) Outpatient Encounter Medications as of 04/28/2023  Medication Sig   traMADol (ULTRAM) 50 MG tablet TAKE 1 TABLET BY MOUTH EVERY 12 (TWELVE) HOURS AS NEEDED FOR MODERATE PAIN OR SEVERE PAIN.   amLODipine (NORVASC) 5 MG tablet Take 1 tablet (5 mg total) by mouth daily.   atorvastatin (LIPITOR) 20 MG tablet Take 1 tablet by mouth once daily   buPROPion (WELLBUTRIN XL) 150 MG 24 hr tablet Take 1 tablet (150 mg total) by mouth daily.   buPROPion (WELLBUTRIN XL) 300 MG 24 hr tablet Take 1 tablet (300 mg total) by mouth daily.   cycloSPORINE (RESTASIS) 0.05 % ophthalmic emulsion Place 1 drop into both eyes 2 (two) times daily.   furosemide (LASIX) 40 MG tablet Take 1  tablet (40 mg total) by mouth daily.   gabapentin (NEURONTIN) 300 MG capsule Take 2 capsules (600 mg total) by mouth 3 (three) times daily.   levothyroxine (SYNTHROID) 137 MCG tablet TAKE 1 TABLET BY MOUTH DAILY BEFORE BREAKFAST   losartan-hydrochlorothiazide (HYZAAR) 50-12.5 MG tablet Take 1 tablet by mouth daily.   Lutein 40 MG CAPS Take by mouth.   NON FORMULARY daily.   nystatin powder Apply topically 2 (two) times daily.   Probiotic Product (PROBIOTIC PO) Take by mouth daily.   sertraline (ZOLOFT) 50 MG tablet Take 1 tablet (50 mg total) by mouth daily.   temazepam (RESTORIL) 30 MG capsule Take 1 capsule (30 mg total) by mouth at bedtime.   valACYclovir (VALTREX) 500 MG tablet Take 1 tablet (500 mg total) by mouth 2 (two) times daily.   No facility-administered encounter medications on file as of 04/28/2023.    Allergies (verified) Lisinopril, Prednisone, Sulfa antibiotics, Codeine, Latex, Nabumetone, and Nickel   History: Past Medical History:  Diagnosis Date   Allergy    Anemia    Anxiety    Arthritis    Cervicogenic headache 03/05/2019   Concussion with no loss of consciousness 03/05/2019   Depression    Hypertension    Thyroid disease    Past Surgical History:  Procedure Laterality Date   ABDOMINAL HYSTERECTOMY     APPENDECTOMY     LUMBAR LAMINECTOMY     L3-4 and  L4-5 x 2   Family History  Problem Relation Age of Onset   Transient ischemic attack Mother    Dementia Mother    Rectal cancer Father    Colon cancer Father    Cancer Father    Colon polyps Brother    Psoriasis Brother    Parkinson's disease Maternal Uncle    Stomach cancer Other    Arthritis Other    High blood pressure Other        "everybody"   Stomach cancer Other    Neuropathy Neg Hx    Esophageal cancer Neg Hx    Social History   Socioeconomic History   Marital status: Married    Spouse name: Not on file   Number of children: Not on file   Years of education: Not on file    Highest education level: 12th grade  Occupational History   Not on file  Tobacco Use   Smoking status: Former    Current packs/day: 0.00    Types: Cigarettes    Quit date: 1998    Years since quitting: 26.9   Smokeless tobacco: Never  Vaping Use   Vaping status: Never Used  Substance and Sexual Activity   Alcohol use: Yes    Alcohol/week: 7.0 standard drinks of alcohol    Types: 7 Standard drinks or equivalent per week    Comment: vodka, sometimes wine    Drug use: Never   Sexual activity: Not on file  Other Topics Concern   Not on file  Social History Narrative   Lives at home with husband    Right handed   Caffeine: none    Social Drivers of Corporate investment banker Strain: Low Risk  (04/28/2023)   Overall Financial Resource Strain (CARDIA)    Difficulty of Paying Living Expenses: Not hard at all  Food Insecurity: No Food Insecurity (04/28/2023)   Hunger Vital Sign    Worried About Running Out of Food in the Last Year: Never true    Ran Out of Food in the Last Year: Never true  Transportation Needs: No Transportation Needs (04/28/2023)   PRAPARE - Administrator, Civil Service (Medical): No    Lack of Transportation (Non-Medical): No  Physical Activity: Insufficiently Active (04/28/2023)   Exercise Vital Sign    Days of Exercise per Week: 2 days    Minutes of Exercise per Session: 30 min  Stress: No Stress Concern Present (04/28/2023)   [employer] of Occupational Health - Occupational Stress Questionnaire    Feeling of Stress : Not at all  Social Connections: Moderately Isolated (04/28/2023)   Social Connection and Isolation Panel [NHANES]    Frequency of Communication with Friends and Family: More than three times a week    Frequency of Social Gatherings with Friends and Family: More than three times a week    Attends Religious Services: Never    Database administrator or Organizations: No    Attends Engineer, structural: Never     Marital Status: Married    Tobacco Counseling Counseling given: Not Answered   Clinical Intake:  Pre-visit preparation completed: Yes  Pain : No/denies pain     BMI - recorded: 27.88 Nutritional Status: BMI 25 -29 Overweight Nutritional Risks: None Diabetes: No  How often do you need to have someone help you when you read instructions, pamphlets, or other written materials from your doctor or pharmacy?: 1 - Never  Interpreter Needed?: No  Information  entered by :: Theresa Mulligan LPN   Activities of Daily Living    04/28/2023    3:58 PM  In your present state of health, do you have any difficulty performing the following activities:  Hearing? 0  Vision? 0  Difficulty concentrating or making decisions? 0  Walking or climbing stairs? 0  Dressing or bathing? 0  Doing errands, shopping? 0  Preparing Food and eating ? N  Using the Toilet? N  In the past six months, have you accidently leaked urine? N  Do you have problems with loss of bowel control? N  Managing your Medications? N  Managing your Finances? N  Housekeeping or managing your Housekeeping? N    Patient Care Team: Philip Aspen, Limmie Patricia, MD as PCP - General (Internal Medicine)  Indicate any recent Medical Services you may have received from other than [facility] providers in the past year (date may be approximate).     Assessment:   This is a routine wellness examination for Rometta Cotrell.  Hearing/Vision screen Hearing Screening - Comments:: Denies hearing difficulties   Vision Screening - Comments:: Wears rx glasses - up to date with routine eye exams with  [facility]   Goals Addressed               This Visit's Progress     Retire (pt-stated)         Depression Screen    04/28/2023    3:57 PM 03/10/2023    4:03 PM 08/20/2022    9:03 AM 03/27/2022    8:04 AM 01/21/2022    3:22 PM 10/10/2021   11:06 AM 05/23/2021    2:51 PM  PHQ 2/9 Scores  PHQ - 2 Score 0 3 0 0 0 0 0  PHQ- 9 Score 0  12 2 1  2 5     Fall Risk    04/28/2023    3:59 PM 03/10/2023    4:03 PM 08/20/2022    9:03 AM 03/27/2022    8:04 AM 01/21/2022    3:21 PM  Fall Risk   Falls in the past year? 0 1 0 0 0  Number falls in past yr: 0 0 0 0 0  Injury with Fall? 0 0 0 0 0  Risk for fall due to : No Fall Risks   No Fall Risks No Fall Risks  Follow up Falls prevention discussed  Falls evaluation completed Falls evaluation completed Falls evaluation completed    MEDICARE RISK AT HOME: Medicare Risk at Home Any stairs in or around the home?: Yes If so, are there any without handrails?: No Home free of loose throw rugs in walkways, pet beds, electrical cords, etc?: Yes Adequate lighting in your home to reduce risk of falls?: Yes Life alert?: No Use of a cane, walker or w/c?: No Grab bars in the bathroom?: Yes Shower chair or bench in shower?: Yes Elevated toilet seat or a handicapped toilet?: No  TIMED UP AND GO:  Was the test performed?  No    Cognitive Function:        04/28/2023    3:59 PM  6CIT Screen  What Year? 0 points  What month? 0 points  What time? 0 points  Count back from 20 0 points  Months in reverse 0 points  Repeat phrase 0 points  Total Score 0 points    Immunizations Immunization History  Administered Date(s) Administered   Fluad Quad(high Dose 65+) 03/08/2021, 01/21/2022   Influenza Split 03/07/2014,  03/18/2016   Influenza, High Dose Seasonal PF 02/23/2018, 03/02/2019, 02/12/2023   Influenza-Unspecified 04/02/2017   Moderna Sars-Covid-2 Vaccination 07/29/2019, 08/07/2019, 08/26/2019, 03/28/2020, 04/04/2021   PNEUMOCOCCAL CONJUGATE-20 04/12/2021   Pfizer Covid-19 Vaccine Bivalent Booster 39yrs & up 04/04/2021   Tdap 02/25/2019   Zoster Recombinant(Shingrix) 02/25/2019, 06/03/2020   Zoster, Live 05/03/2015    TDAP status: Up to date  Flu Vaccine status: Up to date  Pneumococcal vaccine status: Up to date  Covid-19 vaccine status: Declined, Education has been  provided regarding the importance of this vaccine but patient still declined. Advised may receive this vaccine at local pharmacy or Health Dept.or vaccine clinic. Aware to provide a copy of the vaccination record if obtained from local pharmacy or Health Dept. Verbalized acceptance and understanding.  Qualifies for Shingles Vaccine? Yes   Zostavax completed Yes   Shingrix Completed?: Yes  Screening Tests Health Maintenance  Topic Date Due   Hepatitis C Screening  Never done   COVID-19 Vaccine (7 - 2024-25 season) 01/05/2023   Medicare Annual Wellness (AWV)  04/27/2024   MAMMOGRAM  07/24/2024   Colonoscopy  01/30/2028   DTaP/Tdap/Td (2 - Td or Tdap) 02/24/2029   Pneumonia Vaccine 34+ Years old  Completed   INFLUENZA VACCINE  Completed   DEXA SCAN  Completed   Zoster Vaccines- Shingrix  Completed   HPV VACCINES  Aged Out    Health Maintenance  Health Maintenance Due  Topic Date Due   Hepatitis C Screening  Never done   COVID-19 Vaccine (7 - 2024-25 season) 01/05/2023    Colorectal cancer screening: Type of screening: Colonoscopy. Completed 01/30/23. Repeat every 5 years  Mammogram status: Completed 07/25/22. Repeat every year  Bone Density status: Completed 11/18/20. Results reflect: Bone density results: NORMAL. Repeat every   years.    Additional Screening:  Hepatitis C Screening: does qualify;  Deferred  Vision Screening: Recommended annual ophthalmology exams for early detection of glaucoma and other disorders of the eye. Is the patient up to date with their annual eye exam?  Yes  Who is the provider or what is the name of the office in which the patient attends annual eye exams? [facility] If pt is not established with a provider, would they like to be referred to a provider to establish care? No .   Dental Screening: Recommended annual dental exams for proper oral hygiene    Community Resource Referral / Chronic Care Management:  CRR required this visit?  No    CCM required this visit?  No     Plan:     I have personally reviewed and noted the following in the patient's chart:   Medical and social history Use of alcohol, tobacco or illicit drugs  Current medications and supplements including opioid prescriptions. Patient is currently taking opioid prescriptions. Information provided to patient regarding non-opioid alternatives. Patient advised to discuss non-opioid treatment plan with their provider. Functional ability and status Nutritional status Physical activity Advanced directives List of other physicians Hospitalizations, surgeries, and ER visits in previous 12 months Vitals Screenings to include cognitive, depression, and falls Referrals and appointments  In addition, I have reviewed and discussed with patient certain preventive protocols, quality metrics, and best practice recommendations. A written personalized care plan for preventive services as well as general preventive health recommendations were provided to patient.     Tillie Rung, LPN   [id number]   After Visit Summary: (MyChart) Due to this being a telephonic visit, the after visit summary  with patients personalized plan was offered to patient via MyChart   Nurse Notes: None

## 2023-05-02 ENCOUNTER — Encounter: Payer: Self-pay | Admitting: Internal Medicine

## 2023-05-05 MED ORDER — AMLODIPINE BESYLATE 5 MG PO TABS: 5.0000 mg | ORAL_TABLET | Freq: Every day | ORAL | 1 refills | Status: DC

## 2023-05-05 NOTE — Addendum Note (Signed)
Addended by: Philipp Deputy A on: 05/05/2023 11:53 AM   Modules accepted: Orders

## 2023-05-12 ENCOUNTER — Other Ambulatory Visit: Payer: Self-pay | Admitting: Internal Medicine

## 2023-05-12 ENCOUNTER — Other Ambulatory Visit: Payer: Self-pay | Admitting: Physical Medicine and Rehabilitation

## 2023-05-20 ENCOUNTER — Other Ambulatory Visit: Payer: Self-pay | Admitting: Internal Medicine

## 2023-05-20 DIAGNOSIS — G479 Sleep disorder, unspecified: Secondary | ICD-10-CM

## 2023-05-20 MED ORDER — TEMAZEPAM 30 MG PO CAPS: 30.0000 mg | ORAL_CAPSULE | Freq: Every day | ORAL | 0 refills | Status: DC

## 2023-05-30 ENCOUNTER — Encounter: Payer: Self-pay | Admitting: Internal Medicine

## 2023-05-30 ENCOUNTER — Telehealth: Payer: Self-pay | Admitting: Physical Medicine and Rehabilitation

## 2023-05-30 DIAGNOSIS — F339 Major depressive disorder, recurrent, unspecified: Secondary | ICD-10-CM

## 2023-05-30 NOTE — Telephone Encounter (Signed)
Patient request an appointment for another lower back injection

## 2023-06-02 ENCOUNTER — Encounter: Payer: Self-pay | Admitting: Physical Medicine and Rehabilitation

## 2023-06-02 ENCOUNTER — Ambulatory Visit: Payer: Medicare Other | Admitting: Physical Medicine and Rehabilitation

## 2023-06-02 ENCOUNTER — Encounter: Payer: Self-pay | Admitting: Internal Medicine

## 2023-06-02 DIAGNOSIS — M5442 Lumbago with sciatica, left side: Secondary | ICD-10-CM

## 2023-06-02 DIAGNOSIS — M961 Postlaminectomy syndrome, not elsewhere classified: Secondary | ICD-10-CM

## 2023-06-02 DIAGNOSIS — G8929 Other chronic pain: Secondary | ICD-10-CM

## 2023-06-02 DIAGNOSIS — G894 Chronic pain syndrome: Secondary | ICD-10-CM

## 2023-06-02 DIAGNOSIS — M5416 Radiculopathy, lumbar region: Secondary | ICD-10-CM | POA: Diagnosis not present

## 2023-06-02 DIAGNOSIS — F339 Major depressive disorder, recurrent, unspecified: Secondary | ICD-10-CM

## 2023-06-02 DIAGNOSIS — M5441 Lumbago with sciatica, right side: Secondary | ICD-10-CM

## 2023-06-02 MED ORDER — BUPROPION HCL ER (XL) 150 MG PO TB24: 150.0000 mg | ORAL_TABLET | Freq: Every day | ORAL | 1 refills | Status: DC

## 2023-06-02 MED ORDER — BUPROPION HCL ER (XL) 300 MG PO TB24: 300.0000 mg | ORAL_TABLET | Freq: Every day | ORAL | 1 refills | Status: DC

## 2023-06-02 MED ORDER — LOSARTAN POTASSIUM-HCTZ 50-12.5 MG PO TABS: 1.0000 | ORAL_TABLET | Freq: Every day | ORAL | 1 refills | Status: DC

## 2023-06-02 MED ORDER — SERTRALINE HCL 50 MG PO TABS: 50.0000 mg | ORAL_TABLET | Freq: Every day | ORAL | 1 refills | Status: DC

## 2023-06-02 NOTE — Progress Notes (Unsigned)
Lower back pain.  Had the ESI injections before but its been over a year she had to come in for an office visit.  She stated that the injections did help with her back, but pain is getting worse and she is ready for another injection.

## 2023-06-02 NOTE — Progress Notes (Unsigned)
Sharon Anthony - 73 y.o. female MRN [id number]  Date of birth: Apr 17, 1951  Office Visit Note: Visit Date: 06/02/2023 PCP: Philip Aspen, Limmie Patricia, MD Referred by: Philip Aspen, Estel*  Subjective: Chief Complaint  Patient presents with   Lower Back - Pain   HPI: Sharon Anthony is a 73 y.o. female who comes in today for evaluation of chronic, worsening and severe bilateral lower back pain radiating down both legs, right greater than left. Numbness/tingling noted to bilateral feet. Patient is well known to [place]. Pain ongoing for several years. Her pain worsens with prolonged sitting and standing/activity. Household tasks such as vacuuming cause severe pain. She describes her pain as constant sharp and sore sensation, currently rates as 8 out of 10. Some relief of pain with home exercise regimen, use of lumbar brace, rest and use of medications. Does take Gabapentin daily and Tramadol as needed. Patients lumbar MRI imaging from 2022 exhibits bilateral laminectomy at L3-L4, bilateral laminectomy at L4-L5 with 7mm anterolisthesis and moderate to severe multi-factorial stenosis bilaterally at this same level. Patient states she was previously treated by Dr. Boone Master at [facility] in [place], [place] and reports she did receive  lumbar epidural steroid injections at that time which did seem to help relieve her pain. She has undergone several lumbar epidural steroid injections in our office over the years, most recent was bilateral L5 transforaminal epidural steroid injection on 03/20/2022. She reports significant relief of pain with these injections, greater than 50% for several months. 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  I did discuss spinal cord stimulator with her in the past, however she is not interested in this procedure at this time. Patient denies focal weakness. No recent trauma or falls.          Review of Systems  Musculoskeletal:  Positive for back pain.   Neurological:  Positive for tingling. Negative for focal weakness and weakness.  All other systems reviewed and are negative.  Otherwise per HPI.  Assessment & Plan: Visit Diagnoses:    ICD-10-CM   1. Chronic bilateral low back pain with bilateral sciatica  M54.42 MR LUMBAR SPINE WO CONTRAST   M54.41    G89.29     2. Lumbar radiculopathy  M54.16 MR LUMBAR SPINE WO CONTRAST    3. Post laminectomy syndrome  M96.1 MR LUMBAR SPINE WO CONTRAST    4. Chronic pain syndrome  G89.4 MR LUMBAR SPINE WO CONTRAST       Plan: Findings:  Chronic, worsening and severe bilateral lower back pain radiating down both legs, right greater than left in the setting of chronic pain and multiple lumbar surgeries. Patient continues to have severe pain despite good conservative therapies such as home exercise regimen, rest and use of medications. Patients clinical presentation and exam are consistent with lumbar radiculopathy, more of L5 nerve pattern. There is moderately large central disc protrusion and moderate to severe subarticular/foraminal stenosis on the right and moderate subarticular stenosis on the left at L4-L5. We discussed treatment plan in detail today, I placed order for lumbar MRI imaging. Depending on results of imaging we discussed repeating bilateral L5 transforaminal epidural steroid injection. Should she change her mind regarding spinal cord stimulator, I am happy to discuss this procedure in more detail. Patient has no questions at this time. I will speak with her after lumbar MRI imaging is done. I encouraged her to remain active and to continue with conservative therapies. No red flag symptoms noted upon  exam today.   Of note, I did evaluate patient in October of 2024 post fall and complaining of coccyx and left rib pain. She is feeling much better at this time.     Meds & Orders: No orders of the defined types were placed in this encounter.   Orders Placed This Encounter  Procedures   MR  LUMBAR SPINE WO CONTRAST    Follow-up: Return for Lumbar MRI review.   Procedures: No procedures performed      Clinical History: CLINICAL DATA:  Osteoarthritis. Lumbosacral back pain. Lumbar radiculopathy with left leg pain.   EXAM: MRI LUMBAR SPINE WITHOUT CONTRAST   TECHNIQUE: Multiplanar, multisequence MR imaging of the lumbar spine was performed. No intravenous contrast was administered.   COMPARISON:  None.   FINDINGS: Segmentation:  Normal   Alignment:  Mild retrolisthesis L2-3.  7 mm anterolisthesis L4-5.   Vertebrae:  Negative for fracture or mass.   Conus medullaris and cauda equina: Conus extends to the L1-2 level. Conus and cauda equina appear normal.   Paraspinal and other soft tissues: Negative for paraspinous mass or adenopathy.   Disc levels:   Disc degeneration and Schmorl's nodes at T10-11 and T11-T12 and T12-L1 without significant spinal stenosis   L1-2: Disc degeneration and disc bulging with Schmorl's node. Negative for stenosis   L2-3: Mild retrolisthesis. Mild disc bulging and mild facet degeneration. Mild subarticular stenosis bilaterally.   L3-4: Bilateral laminectomy. Spinal canal normal in size. Disc degeneration with disc bulging and bilateral facet hypertrophy. Moderate subarticular and foraminal stenosis bilaterally left greater than right   L4-5: Bilateral laminectomy with adequate decompression of spinal canal. 6 mm anterolisthesis. Moderately large central disc protrusion. Bilateral facet hypertrophy. Moderate to severe subarticular and foraminal stenosis on the right. Moderate subarticular stenosis on the left.   L5-S1: Disc degeneration with diffuse disc bulging and endplate spurring. Bilateral facet hypertrophy. Severe foraminal encroachment bilaterally due to spurring.   IMPRESSION: Bilateral laminectomy L3-4. Endplate spurring and facet hypertrophy contribute to moderate subarticular and foraminal  stenosis bilaterally left greater than right   Bilateral laminectomy L4-5 with 7 mm anterolisthesis. Moderate to severe subarticular and foraminal stenosis on the right and moderate subarticular stenosis on the left   Severe foraminal encroachment bilaterally L5-S1 due to spurring.     Electronically Signed   By: Marlan Palau M.D.   On: 06/28/2020 13:27   She reports that she quit smoking about 27 years ago. Her smoking use included cigarettes. She has never used smokeless tobacco. No results for input(s): "HGBA1C", "LABURIC" in the last 8760 hours.  Objective:  VS:  HT:    WT:   BMI:     BP:   HR: bpm  TEMP: ( )  RESP:  Physical Exam Vitals and nursing note reviewed.  HENT:     Head: Normocephalic and atraumatic.     Right Ear: External ear normal.     Left Ear: External ear normal.     Nose: Nose normal.     Mouth/Throat:     Mouth: Mucous membranes are moist.  Eyes:     Extraocular Movements: Extraocular movements intact.  Cardiovascular:     Rate and Rhythm: Normal rate.     Pulses: Normal pulses.  Pulmonary:     Effort: Pulmonary effort is normal.  Abdominal:     General: Abdomen is flat. There is no distension.  Musculoskeletal:        General: Tenderness present.     Cervical back:  Normal range of motion.     Comments: Patient rises from seated position to standing without difficulty. Good lumbar range of motion. No pain noted with facet loading. 5/5 strength noted with bilateral hip flexion, knee flexion/extension, ankle dorsiflexion/plantarflexion and EHL. No clonus noted bilaterally. No pain upon palpation of greater trochanters. No pain with internal/external rotation of bilateral hips. Sensation intact bilaterally. Dysesthesias noted to bilateral L5 dermatomes. Negative slump test bilaterally. Ambulates without aid, gait steady.     Skin:    General: Skin is warm and dry.     Capillary Refill: Capillary refill takes less than 2 seconds.  Neurological:      General: No focal deficit present.     Mental Status: She is alert and oriented to person, place, and time.  Psychiatric:        Mood and Affect: Mood normal.        Behavior: Behavior normal.     Ortho Exam  Imaging: No results found.  Past Medical/Family/Surgical/Social History: Medications & Allergies reviewed per EMR, new medications updated. 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 02/15/2015   Thoracic and lumbosacral neuritis 04/20/2014   Encounter for other specified surgical aftercare 03/18/2014   Acquired hallux valgus 01/03/2014   Past Medical History:  Diagnosis Date   Allergy    Anemia    Anxiety    Arthritis    Cervicogenic headache 03/05/2019   Concussion with no loss of consciousness 03/05/2019   Depression    Hypertension    Thyroid disease    Family History  Problem Relation Age of Onset   Transient ischemic attack Mother    Dementia Mother    Rectal cancer Father    Colon cancer Father    Cancer Father    Colon polyps Brother    Psoriasis Brother    Parkinson's disease Maternal Uncle    Stomach cancer Other    Arthritis Other    High blood pressure Other        "everybody"   Stomach cancer Other    Neuropathy Neg Hx    Esophageal cancer Neg Hx    Past Surgical History:  Procedure Laterality Date   ABDOMINAL HYSTERECTOMY     APPENDECTOMY     LUMBAR LAMINECTOMY     L3-4 and L4-5 x 2   Social History   Occupational History   Not on file  Tobacco Use   Smoking status: Former    Current  packs/day: 0.00    Types: Cigarettes    Quit date: 1998    Years since quitting: 27.0   Smokeless tobacco: Never  Vaping Use   Vaping status: Never Used  Substance and Sexual Activity   Alcohol use: Yes    Alcohol/week: 7.0 standard drinks of alcohol    Types: 7 Standard drinks or equivalent per week    Comment: vodka, sometimes wine    Drug use: Never   Sexual activity: Not on file

## 2023-06-09 ENCOUNTER — Other Ambulatory Visit: Payer: Self-pay | Admitting: Internal Medicine

## 2023-06-09 DIAGNOSIS — G479 Sleep disorder, unspecified: Secondary | ICD-10-CM

## 2023-06-09 MED ORDER — TEMAZEPAM 30 MG PO CAPS: 30.0000 mg | ORAL_CAPSULE | Freq: Every day | ORAL | 0 refills | Status: DC

## 2023-06-11 ENCOUNTER — Encounter: Payer: Self-pay | Admitting: Internal Medicine

## 2023-06-11 DIAGNOSIS — R768 Other specified abnormal immunological findings in serum: Secondary | ICD-10-CM

## 2023-06-11 NOTE — Telephone Encounter (Signed)
Spoke to pharmacist and patient picked up #[phone] 05/21/23, so it is too early to fill the prescription.

## 2023-06-16 ENCOUNTER — Ambulatory Visit
Admission: RE | Admit: 2023-06-16 | Discharge: 2023-06-16 | Disposition: A | Discharge status: Home / Self Care | Payer: Medicare Other | Source: Ambulatory Visit | Attending: Physical Medicine and Rehabilitation | Admitting: Physical Medicine and Rehabilitation

## 2023-06-16 DIAGNOSIS — G894 Chronic pain syndrome: Secondary | ICD-10-CM

## 2023-06-16 DIAGNOSIS — M5416 Radiculopathy, lumbar region: Secondary | ICD-10-CM

## 2023-06-16 DIAGNOSIS — G8929 Other chronic pain: Secondary | ICD-10-CM

## 2023-06-16 DIAGNOSIS — M961 Postlaminectomy syndrome, not elsewhere classified: Secondary | ICD-10-CM

## 2023-06-22 ENCOUNTER — Other Ambulatory Visit: Payer: Self-pay | Admitting: Physical Medicine and Rehabilitation

## 2023-06-22 ENCOUNTER — Other Ambulatory Visit: Payer: Self-pay | Admitting: Internal Medicine

## 2023-06-22 DIAGNOSIS — G479 Sleep disorder, unspecified: Secondary | ICD-10-CM

## 2023-06-23 MED ORDER — VALACYCLOVIR HCL 500 MG PO TABS: 500.0000 mg | ORAL_TABLET | Freq: Two times a day (BID) | ORAL | 5 refills | Status: DC

## 2023-06-23 MED ORDER — GABAPENTIN 300 MG PO CAPS: 600.0000 mg | ORAL_CAPSULE | Freq: Three times a day (TID) | ORAL | 0 refills | Status: DC

## 2023-07-07 ENCOUNTER — Encounter: Payer: Self-pay | Admitting: Physical Medicine and Rehabilitation

## 2023-07-07 ENCOUNTER — Ambulatory Visit (INDEPENDENT_AMBULATORY_CARE_PROVIDER_SITE_OTHER): Payer: Medicare Other | Admitting: Physical Medicine and Rehabilitation

## 2023-07-07 VITALS — BP 154/87 | HR 60

## 2023-07-07 DIAGNOSIS — M5416 Radiculopathy, lumbar region: Secondary | ICD-10-CM | POA: Diagnosis not present

## 2023-07-07 DIAGNOSIS — M961 Postlaminectomy syndrome, not elsewhere classified: Secondary | ICD-10-CM | POA: Diagnosis not present

## 2023-07-07 DIAGNOSIS — M5442 Lumbago with sciatica, left side: Secondary | ICD-10-CM

## 2023-07-07 DIAGNOSIS — M5441 Lumbago with sciatica, right side: Secondary | ICD-10-CM | POA: Diagnosis not present

## 2023-07-07 DIAGNOSIS — G8929 Other chronic pain: Secondary | ICD-10-CM

## 2023-07-07 NOTE — Progress Notes (Unsigned)
 MRI Review Pain Score---7

## 2023-07-07 NOTE — Patient Instructions (Signed)

## 2023-07-07 NOTE — Progress Notes (Unsigned)
 Sharon Anthony - 73 y.o. female MRN [id number]  Date of birth: 05-02-1951  Office Visit Note: Visit Date: 07/07/2023 PCP: Philip Aspen, Limmie Patricia, MD Referred by: Philip Aspen, Estel*  Subjective: Chief Complaint  Patient presents with   Lower Back - Pain   HPI: Sharon Anthony is a 73 y.o. female who comes in today for evaluation of chronic, worsening and severe bilateral lower back pain radiating down both anterolateral legs to feet. Paresthesias to both feet. Patient is well known to [place]. Pain ongoing for several years. Her pain worsens with prolonged sitting and standing/activity. Household tasks such as vacuuming cause severe pain. She describes her pain as constant sharp and sore sensation, currently rates as 8 out of 10. Some relief of pain with home exercise regimen, use of lumbar brace, rest and use of medications. Does take Gabapentin daily and Tramadol as needed. Recent lumbar MRI imaging shows no acute findings or significant change from previous MRI in 2022. Stable postsurgical changes from previous posterior decompression from L2-L3 through L5-S1. Residual mild central stenosis, right lateral recess narrowing and severe right foraminal narrowing at L4-L5. Stable moderate to severe foraminal narrowing bilaterally at L5-S1. Prior lumbar surgeries in 2016 and 2018. Patient was previously treated by Dr. Boone Master at [facility] in [place], [place] and reports she did receive  lumbar epidural steroid injections at that time which did seem to help relieve her pain. She has undergone several lumbar epidural steroid injections in our office over the years, most recent was bilateral L5 transforaminal epidural steroid injection on 03/20/2022. She reports significant relief of pain with these injections, greater than 50% for several months. 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 Patient denies focal weakness. No recent trauma or falls.      Review of Systems  Musculoskeletal:  Positive for back  pain.  Neurological:  Positive for tingling. Negative for focal weakness and weakness.  All other systems reviewed and are negative.  Otherwise per HPI.  Assessment & Plan: Visit Diagnoses:    ICD-10-CM   1. Chronic bilateral low back pain with bilateral sciatica  M54.42 Ambulatory referral to Physical Medicine Rehab   M54.41    G89.29     2. Lumbar radiculopathy  M54.16 Ambulatory referral to Physical Medicine Rehab    3. Post laminectomy syndrome  M96.1 Ambulatory referral to Physical Medicine Rehab       Plan: Findings:  Chronic, worsening and severe bilateral lower back pain radiating down both anterolateral legs to feet. Paresthesias to both feet. Patient continues to have severe pain despite good conservative therapies such as home exercise regimen, rest and use of medications. Patients clinical presentation and exam are consistent with L5 nerve pattern. She has done well with transforaminal epidural steroid injections in the past. Next step is to perform diagnostic and hopefully therapeutic bilateral L5 transforaminal epidural steroid injection under fluoroscopic guidance.  If good relief of pain with injection we can repeat this procedure infrequently as needed. Other treatment options include spinal cord stimulator placement, would also consider referral to our spine surgeon Dr. Willia Craze to discuss options. She has no questions at this time, we will see her back for injection. No red flag symptoms noted upon exam today.     Meds & Orders: No orders of the defined types were placed in this encounter.   Orders Placed This Encounter  Procedures   Ambulatory referral to Physical Medicine Rehab    Follow-up: Return for Bilateral L5 transforaminal epidural  steroid injection.   Procedures: No procedures performed      Clinical History: CLINICAL DATA:  Chronic low back pain radiating into the legs and feet for 3 years. No known injury.   EXAM: MRI LUMBAR SPINE WITHOUT  CONTRAST   TECHNIQUE: Multiplanar, multisequence MR imaging of the lumbar spine was performed. No intravenous contrast was administered.   COMPARISON:  Lumbar MRI 06/28/2020.   FINDINGS: Segmentation: Conventional anatomy assumed, with the last open disc space designated L5-S1.Concordant with previous MRI.   Alignment: Stable degenerative grade 1 anterolisthesis at L4-5 and minimal retrolisthesis at L5-S1.   Vertebrae: No worrisome osseous lesion, acute fracture or pars defect. Chronic postsurgical changes and endplate degenerative changes inferiorly. The lumbar pedicles are short on a congenital basis. The visualized sacroiliac joints appear unremarkable.   Conus medullaris: Extends to the L1-2 level. The conus and cauda equina appear normal.   Paraspinal and other soft tissues: No significant paraspinal findings.   Disc levels:   Sagittal images demonstrate no significant disc space findings within the visualized lower thoracic spine.   L1-2: Preserved disc height and hydration. Stable mild disc bulging, Schmorl's node formation and mild facet hypertrophy. No spinal stenosis or nerve root encroachment.   L2-3: Previous posterior decompression. Stable annular disc bulging and bilateral facet hypertrophy with similar mild narrowing of the lateral recesses and left foramen. The spinal canal is patent.   L3-4: Stable postsurgical changes from previous posterior decompression. Mild loss of disc height with annular disc bulging and endplate osteophytes asymmetric to the left. Mild bilateral facet hypertrophy. The spinal canal remains decompressed. Unchanged mild residual lateral recess and foraminal narrowing bilaterally.   L4-5: Stable postsurgical changes from previous posterior decompression. Moderate loss of disc height with annular disc bulging and endplate osteophytes asymmetric to the right. Stable residual mild spinal stenosis with asymmetric right lateral  recess narrowing. Stable severe right and mild left foraminal narrowing.   L5-S1: Previous posterior decompression. Chronic loss of disc height with annular disc bulging, endplate osteophytes and bilateral facet hypertrophy. The spinal canal and lateral recesses are decompressed. There is chronic moderate to severe foraminal narrowing bilaterally which appears unchanged.   IMPRESSION: 1. No acute findings or significant change from previous MRI of 06/28/2020. 2. Stable postsurgical changes from previous posterior decompression from L2-3 through L5-S1. 3. Stable residual mild spinal stenosis at L4-5 with asymmetric right lateral recess narrowing and severe right foraminal narrowing. 4. Stable moderate to severe foraminal narrowing bilaterally at L5-S1.     Electronically Signed   By: Carey Bullocks M.D.   On: 06/28/2023 11:09   She reports that she quit smoking about 27 years ago. Her smoking use included cigarettes. She has never used smokeless tobacco. No results for input(s): "HGBA1C", "LABURIC" in the last 8760 hours.  Objective:  VS:  HT:    WT:   BMI:     BP:(!) 154/87  HR:60bpm  TEMP: ( )  RESP:  Physical Exam Vitals and nursing note reviewed.  HENT:     Head: Normocephalic and atraumatic.     Right Ear: External ear normal.     Left Ear: External ear normal.     Nose: Nose normal.     Mouth/Throat:     Mouth: Mucous membranes are moist.  Eyes:     Extraocular Movements: Extraocular movements intact.  Cardiovascular:     Rate and Rhythm: Normal rate.     Pulses: Normal pulses.  Pulmonary:     Effort: Pulmonary effort  is normal.  Abdominal:     General: Abdomen is flat. There is no distension.  Musculoskeletal:        General: Tenderness present.     Cervical back: Normal range of motion.     Comments: Patient rises from seated position to standing without difficulty. Good lumbar range of motion. No pain noted with facet loading. 5/5 strength noted with  bilateral hip flexion, knee flexion/extension, ankle dorsiflexion/plantarflexion and EHL. No clonus noted bilaterally. No pain upon palpation of greater trochanters. No pain with internal/external rotation of bilateral hips. Sensation intact bilaterally. Dysesthesias noted to bilateral L5 dermatome. Negative slump test bilaterally. Ambulates without aid, gait steady.     Skin:    General: Skin is warm and dry.     Capillary Refill: Capillary refill takes less than 2 seconds.  Neurological:     General: No focal deficit present.     Mental Status: She is alert and oriented to person, place, and time.  Psychiatric:        Mood and Affect: Mood normal.        Behavior: Behavior normal.     Ortho Exam  Imaging: No results found.  Past Medical/Family/Surgical/Social History: Medications & Allergies reviewed per EMR, new medications updated. 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 02/15/2015   Thoracic and lumbosacral neuritis 04/20/2014   Encounter for other specified surgical aftercare 03/18/2014   Acquired hallux valgus 01/03/2014   Past Medical History:  Diagnosis Date   Allergy    Anemia    Anxiety    Arthritis    Cervicogenic headache 03/05/2019   Concussion with no loss of consciousness 03/05/2019   Depression    Hypertension    Thyroid disease    Family History  Problem Relation Age of Onset   Transient ischemic attack Mother    Dementia Mother    Rectal cancer Father    Colon cancer Father    Cancer Father    Colon polyps  Brother    Psoriasis Brother    Parkinson's disease Maternal Uncle    Stomach cancer Other    Arthritis Other    High blood pressure Other        "everybody"   Stomach cancer Other    Neuropathy Neg Hx    Esophageal cancer Neg Hx    Past Surgical History:  Procedure Laterality Date   ABDOMINAL HYSTERECTOMY     APPENDECTOMY     LUMBAR LAMINECTOMY     L3-4 and L4-5 x 2   Social History   Occupational History   Not on file  Tobacco Use   Smoking status: Former    Current packs/day: 0.00    Types: Cigarettes    Quit date: 1998    Years since quitting: 27.1   Smokeless tobacco: Never  Vaping Use   Vaping status: Never Used  Substance and Sexual Activity   Alcohol use: Yes    Alcohol/week: 7.0 standard drinks of alcohol    Types: 7 Standard drinks or equivalent per week    Comment: vodka, sometimes wine    Drug  use: Never   Sexual activity: Not on file

## 2023-07-17 ENCOUNTER — Other Ambulatory Visit: Payer: Self-pay | Admitting: Physical Medicine and Rehabilitation

## 2023-07-23 ENCOUNTER — Other Ambulatory Visit: Payer: Self-pay

## 2023-07-23 ENCOUNTER — Ambulatory Visit: Admitting: Physical Medicine and Rehabilitation

## 2023-07-23 DIAGNOSIS — M5416 Radiculopathy, lumbar region: Secondary | ICD-10-CM | POA: Diagnosis not present

## 2023-07-23 MED ORDER — METHYLPREDNISOLONE ACETATE 40 MG/ML IJ SUSP
40.0000 mg | Freq: Once | INTRAMUSCULAR | Status: AC
  Administered 2023-07-23: 40 mg

## 2023-07-23 NOTE — Progress Notes (Signed)
 Pain Scale   Average Pain 7        +Driver, -BT, -Dye Allergies.

## 2023-07-23 NOTE — Patient Instructions (Signed)

## 2023-07-30 NOTE — Progress Notes (Signed)
 Sharon Anthony - 73 y.o. female MRN [id number]  Date of birth: 1951/04/18  Office Visit Note: Visit Date: 07/23/2023 PCP: Philip Aspen, Limmie Patricia, MD Referred by: Philip Aspen, Estel*  Subjective: Chief Complaint  Patient presents with   Lower Back - Pain   HPI:  Sharon Anthony is a 73 y.o. female who comes in today at the request of Ellin Goodie, FNP for planned Bilateral L5-S1 Lumbar Transforaminal epidural steroid injection with fluoroscopic guidance.  The patient has failed conservative care including home exercise, medications, time and activity modification.  This injection will be diagnostic and hopefully therapeutic.  Please see requesting physician notes for further details and justification.   ROS Otherwise per HPI.  Assessment & Plan: Visit Diagnoses:    ICD-10-CM   1. Lumbar radiculopathy  M54.16 XR C-ARM NO REPORT    Epidural Steroid injection    methylPREDNISolone acetate (DEPO-MEDROL) injection 40 mg      Plan: No additional findings.   Meds & Orders:  Meds ordered this encounter  Medications   methylPREDNISolone acetate (DEPO-MEDROL) injection 40 mg    Orders Placed This Encounter  Procedures   XR C-ARM NO REPORT   Epidural Steroid injection    Follow-up: Return if symptoms worsen or fail to improve.   Procedures: No procedures performed  Lumbosacral Transforaminal Epidural Steroid Injection - Sub-Pedicular Approach with Fluoroscopic Guidance  [name]      Date of Birth: 1951-05-02 MRN: [id number] PCP: Philip Aspen, Limmie Patricia, MD      Visit Date: 07/23/2023   Universal Protocol:    Date/Time: 07/23/2023  Consent Given By: the patient  Position: PRONE  Additional Comments: Vital signs were monitored before and after the procedure. Patient was prepped and draped in the usual sterile fashion. The correct patient, procedure, and site was verified.   Injection Procedure Details:   Procedure diagnoses: Lumbar  radiculopathy [M54.16]    Meds Administered:  Meds ordered this encounter  Medications   methylPREDNISolone acetate (DEPO-MEDROL) injection 40 mg    Laterality: Bilateral  Location/Site: L5  Needle:5.0 in., 22 ga.  Short bevel or Quincke spinal needle  Needle Placement: Transforaminal  Findings:    -Comments: Excellent flow of contrast along the nerve, nerve root and into the epidural space. Significant short lived root pain with delivery of injectate likely due to foraminal stenosis.  Procedure Details: After squaring off the end-plates to get a true AP view, the C-arm was positioned so that an oblique view of the foramen as noted above was visualized. The target area is just inferior to the "nose of the scotty dog" or sub pedicular. The soft tissues overlying this structure were infiltrated with 2-3 ml. of 1% Lidocaine without Epinephrine.  The spinal needle was inserted toward the target using a "trajectory" view along the fluoroscope beam.  Under AP and lateral visualization, the needle was advanced so it did not puncture dura and was located close the 6 O'Clock position of the pedical in AP tracterory. Biplanar projections were used to confirm position. Aspiration was confirmed to be negative for CSF and/or blood. A 1-2 ml. volume of Isovue-250 was injected and flow of contrast was noted at each level. Radiographs were obtained for documentation purposes.   After attaining the desired flow of contrast documented above, a 0.5 to 1.0 ml test dose of 0.25% Marcaine was injected into each respective transforaminal space.  The patient was observed for 90 seconds post injection.  After no sensory deficits  were reported, and normal lower extremity motor function was noted,   the above injectate was administered so that equal amounts of the injectate were placed at each foramen (level) into the transforaminal epidural space.   Additional Comments:  No complications occurred Dressing: 2 x 2  sterile gauze and Band-Aid    Post-procedure details: Patient was observed during the procedure. Post-procedure instructions were reviewed.  Patient left the clinic in stable condition.    Clinical History: CLINICAL DATA:  Chronic low back pain radiating into the legs and feet for 3 years. No known injury.   EXAM: MRI LUMBAR SPINE WITHOUT CONTRAST   TECHNIQUE: Multiplanar, multisequence MR imaging of the lumbar spine was performed. No intravenous contrast was administered.   COMPARISON:  Lumbar MRI 06/28/2020.   FINDINGS: Segmentation: Conventional anatomy assumed, with the last open disc space designated L5-S1.Concordant with previous MRI.   Alignment: Stable degenerative grade 1 anterolisthesis at L4-5 and minimal retrolisthesis at L5-S1.   Vertebrae: No worrisome osseous lesion, acute fracture or pars defect. Chronic postsurgical changes and endplate degenerative changes inferiorly. The lumbar pedicles are short on a congenital basis. The visualized sacroiliac joints appear unremarkable.   Conus medullaris: Extends to the L1-2 level. The conus and cauda equina appear normal.   Paraspinal and other soft tissues: No significant paraspinal findings.   Disc levels:   Sagittal images demonstrate no significant disc space findings within the visualized lower thoracic spine.   L1-2: Preserved disc height and hydration. Stable mild disc bulging, Schmorl's node formation and mild facet hypertrophy. No spinal stenosis or nerve root encroachment.   L2-3: Previous posterior decompression. Stable annular disc bulging and bilateral facet hypertrophy with similar mild narrowing of the lateral recesses and left foramen. The spinal canal is patent.   L3-4: Stable postsurgical changes from previous posterior decompression. Mild loss of disc height with annular disc bulging and endplate osteophytes asymmetric to the left. Mild bilateral facet hypertrophy. The spinal canal  remains decompressed. Unchanged mild residual lateral recess and foraminal narrowing bilaterally.   L4-5: Stable postsurgical changes from previous posterior decompression. Moderate loss of disc height with annular disc bulging and endplate osteophytes asymmetric to the right. Stable residual mild spinal stenosis with asymmetric right lateral recess narrowing. Stable severe right and mild left foraminal narrowing.   L5-S1: Previous posterior decompression. Chronic loss of disc height with annular disc bulging, endplate osteophytes and bilateral facet hypertrophy. The spinal canal and lateral recesses are decompressed. There is chronic moderate to severe foraminal narrowing bilaterally which appears unchanged.   IMPRESSION: 1. No acute findings or significant change from previous MRI of 06/28/2020. 2. Stable postsurgical changes from previous posterior decompression from L2-3 through L5-S1. 3. Stable residual mild spinal stenosis at L4-5 with asymmetric right lateral recess narrowing and severe right foraminal narrowing. 4. Stable moderate to severe foraminal narrowing bilaterally at L5-S1.     Electronically Signed   By: Carey Bullocks M.D.   On: 06/28/2023 11:09     Objective:  VS:  HT:    WT:   BMI:     BP:   HR: bpm  TEMP: ( )  RESP:  Physical Exam Vitals and nursing note reviewed.  Constitutional:      General: She is not in acute distress.    Appearance: Normal appearance. She is not ill-appearing.  HENT:     Head: Normocephalic and atraumatic.     Right Ear: External ear normal.     Left Ear: External ear normal.  Eyes:     Extraocular Movements: Extraocular movements intact.  Cardiovascular:     Rate and Rhythm: Normal rate.     Pulses: Normal pulses.  Pulmonary:     Effort: Pulmonary effort is normal. No respiratory distress.  Abdominal:     General: There is no distension.     Palpations: Abdomen is soft.  Musculoskeletal:        General:  Tenderness present.     Cervical back: Neck supple.     Right lower leg: No edema.     Left lower leg: No edema.     Comments: Patient has good distal strength with no pain over the greater trochanters.  No clonus or focal weakness.  Skin:    Findings: No erythema, lesion or rash.  Neurological:     General: No focal deficit present.     Mental Status: She is alert and oriented to person, place, and time.     Sensory: No sensory deficit.     Motor: No weakness or abnormal muscle tone.     Coordination: Coordination normal.  Psychiatric:        Mood and Affect: Mood normal.        Behavior: Behavior normal.      Imaging: No results found.

## 2023-07-30 NOTE — Procedures (Signed)
 Lumbosacral Transforaminal Epidural Steroid Injection - Sub-Pedicular Approach with Fluoroscopic Guidance  [name]      Date of Birth: 09-Oct-1950 MRN: [id number] PCP: Philip Aspen, Limmie Patricia, MD      Visit Date: 07/23/2023   Universal Protocol:    Date/Time: 07/23/2023  Consent Given By: the patient  Position: PRONE  Additional Comments: Vital signs were monitored before and after the procedure. Patient was prepped and draped in the usual sterile fashion. The correct patient, procedure, and site was verified.   Injection Procedure Details:   Procedure diagnoses: Lumbar radiculopathy [M54.16]    Meds Administered:  Meds ordered this encounter  Medications   methylPREDNISolone acetate (DEPO-MEDROL) injection 40 mg    Laterality: Bilateral  Location/Site: L5  Needle:5.0 in., 22 ga.  Short bevel or Quincke spinal needle  Needle Placement: Transforaminal  Findings:    -Comments: Excellent flow of contrast along the nerve, nerve root and into the epidural space. Significant short lived root pain with delivery of injectate likely due to foraminal stenosis.  Procedure Details: After squaring off the end-plates to get a true AP view, the C-arm was positioned so that an oblique view of the foramen as noted above was visualized. The target area is just inferior to the "nose of the scotty dog" or sub pedicular. The soft tissues overlying this structure were infiltrated with 2-3 ml. of 1% Lidocaine without Epinephrine.  The spinal needle was inserted toward the target using a "trajectory" view along the fluoroscope beam.  Under AP and lateral visualization, the needle was advanced so it did not puncture dura and was located close the 6 O'Clock position of the pedical in AP tracterory. Biplanar projections were used to confirm position. Aspiration was confirmed to be negative for CSF and/or blood. A 1-2 ml. volume of Isovue-250 was injected and flow of contrast was  noted at each level. Radiographs were obtained for documentation purposes.   After attaining the desired flow of contrast documented above, a 0.5 to 1.0 ml test dose of 0.25% Marcaine was injected into each respective transforaminal space.  The patient was observed for 90 seconds post injection.  After no sensory deficits were reported, and normal lower extremity motor function was noted,   the above injectate was administered so that equal amounts of the injectate were placed at each foramen (level) into the transforaminal epidural space.   Additional Comments:  No complications occurred Dressing: 2 x 2 sterile gauze and Band-Aid    Post-procedure details: Patient was observed during the procedure. Post-procedure instructions were reviewed.  Patient left the clinic in stable condition.

## 2023-07-31 ENCOUNTER — Encounter: Payer: Self-pay | Admitting: Internal Medicine

## 2023-07-31 MED ORDER — LEVOTHYROXINE SODIUM 137 MCG PO TABS: 137.0000 ug | ORAL_TABLET | Freq: Every day | ORAL | 1 refills | Status: DC

## 2023-08-04 ENCOUNTER — Other Ambulatory Visit: Payer: Self-pay | Admitting: *Deleted

## 2023-08-04 MED ORDER — ATORVASTATIN CALCIUM 20 MG PO TABS: 20.0000 mg | ORAL_TABLET | Freq: Every day | ORAL | 0 refills | Status: DC

## 2023-08-04 MED ORDER — AMLODIPINE BESYLATE 5 MG PO TABS: 5.0000 mg | ORAL_TABLET | Freq: Every day | ORAL | 0 refills | Status: DC

## 2023-08-06 ENCOUNTER — Encounter: Payer: Self-pay | Admitting: Internal Medicine

## 2023-08-06 MED ORDER — LEVOTHYROXINE SODIUM 137 MCG PO TABS: 137.0000 ug | ORAL_TABLET | Freq: Every day | ORAL | 0 refills | Status: DC

## 2023-08-12 ENCOUNTER — Encounter: Payer: Self-pay | Admitting: Internal Medicine

## 2023-08-12 DIAGNOSIS — R768 Other specified abnormal immunological findings in serum: Secondary | ICD-10-CM

## 2023-08-12 DIAGNOSIS — G479 Sleep disorder, unspecified: Secondary | ICD-10-CM

## 2023-08-13 MED ORDER — TEMAZEPAM 30 MG PO CAPS: 30.0000 mg | ORAL_CAPSULE | Freq: Every day | ORAL | 0 refills | Status: DC

## 2023-08-19 MED ORDER — VALACYCLOVIR HCL 500 MG PO TABS: 500.0000 mg | ORAL_TABLET | Freq: Two times a day (BID) | ORAL | 5 refills | Status: DC

## 2023-08-19 NOTE — Addendum Note (Signed)
 Addended by: Nicolina Barrios B on: 08/19/2023 08:43 AM   Modules accepted: Orders

## 2023-08-26 ENCOUNTER — Emergency Department (HOSPITAL_BASED_OUTPATIENT_CLINIC_OR_DEPARTMENT_OTHER)

## 2023-08-26 ENCOUNTER — Emergency Department (HOSPITAL_BASED_OUTPATIENT_CLINIC_OR_DEPARTMENT_OTHER)
Admission: EM | Admit: 2023-08-26 | Discharge: 2023-08-26 | Discharge status: Against Medical Advice | Payer: Worker's Compensation | Attending: Emergency Medicine | Admitting: Emergency Medicine

## 2023-08-26 ENCOUNTER — Encounter (HOSPITAL_BASED_OUTPATIENT_CLINIC_OR_DEPARTMENT_OTHER): Payer: Self-pay | Admitting: Emergency Medicine

## 2023-08-26 DIAGNOSIS — M25512 Pain in left shoulder: Secondary | ICD-10-CM | POA: Diagnosis not present

## 2023-08-26 DIAGNOSIS — R519 Headache, unspecified: Secondary | ICD-10-CM | POA: Insufficient documentation

## 2023-08-26 DIAGNOSIS — Y92410 Unspecified street and highway as the place of occurrence of the external cause: Secondary | ICD-10-CM | POA: Insufficient documentation

## 2023-08-26 DIAGNOSIS — M542 Cervicalgia: Secondary | ICD-10-CM | POA: Diagnosis not present

## 2023-08-26 DIAGNOSIS — M79602 Pain in left arm: Secondary | ICD-10-CM | POA: Insufficient documentation

## 2023-08-26 DIAGNOSIS — Z5321 Procedure and treatment not carried out due to patient leaving prior to being seen by health care provider: Secondary | ICD-10-CM | POA: Insufficient documentation
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
## 2023-08-28 ENCOUNTER — Encounter: Payer: Self-pay | Admitting: Internal Medicine

## 2023-08-28 NOTE — Telephone Encounter (Signed)
Please see most recent Mychart message

## 2023-09-01 ENCOUNTER — Ambulatory Visit: Admitting: Internal Medicine

## 2023-09-08 ENCOUNTER — Ambulatory Visit: Admitting: Internal Medicine

## 2023-09-09 ENCOUNTER — Encounter: Payer: Self-pay | Admitting: Internal Medicine

## 2023-09-09 ENCOUNTER — Ambulatory Visit (INDEPENDENT_AMBULATORY_CARE_PROVIDER_SITE_OTHER): Admitting: Internal Medicine

## 2023-09-09 VITALS — BP 126/70 | HR 72 | Temp 98.1°F | Ht 67.0 in | Wt 183.3 lb

## 2023-09-09 DIAGNOSIS — G479 Sleep disorder, unspecified: Secondary | ICD-10-CM

## 2023-09-09 DIAGNOSIS — R768 Other specified abnormal immunological findings in serum: Secondary | ICD-10-CM

## 2023-09-09 DIAGNOSIS — M5127 Other intervertebral disc displacement, lumbosacral region: Secondary | ICD-10-CM

## 2023-09-09 MED ORDER — GABAPENTIN 300 MG PO CAPS: 600.0000 mg | ORAL_CAPSULE | Freq: Three times a day (TID) | ORAL | 0 refills | Status: DC

## 2023-09-09 MED ORDER — VALACYCLOVIR HCL 500 MG PO TABS
500.0000 mg | ORAL_TABLET | Freq: Two times a day (BID) | ORAL | 5 refills | Status: DC
Start: 2023-09-09 — End: 2024-01-01

## 2023-09-09 MED ORDER — TEMAZEPAM 30 MG PO CAPS: 30.0000 mg | ORAL_CAPSULE | Freq: Every day | ORAL | 0 refills | Status: DC
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
## 2023-09-11 ENCOUNTER — Telehealth: Payer: Self-pay

## 2023-09-11 ENCOUNTER — Ambulatory Visit (INDEPENDENT_AMBULATORY_CARE_PROVIDER_SITE_OTHER): Payer: Worker's Compensation | Admitting: Orthopedic Surgery

## 2023-09-11 ENCOUNTER — Encounter: Payer: Self-pay | Admitting: Orthopedic Surgery

## 2023-09-11 ENCOUNTER — Other Ambulatory Visit (HOSPITAL_COMMUNITY): Payer: Self-pay

## 2023-09-11 DIAGNOSIS — M4722 Other spondylosis with radiculopathy, cervical region: Secondary | ICD-10-CM

## 2023-09-11 DIAGNOSIS — M5416 Radiculopathy, lumbar region: Secondary | ICD-10-CM | POA: Diagnosis not present

## 2023-09-11 MED ORDER — METHOCARBAMOL 500 MG PO TABS: 500.0000 mg | ORAL_TABLET | Freq: Three times a day (TID) | ORAL | 0 refills | Status: AC | PRN

## 2023-09-11 NOTE — Telephone Encounter (Signed)
 Pharmacy Patient Advocate Encounter  Received notification from [facility] that Prior Authorization for Temazepam  30MG  capsules has been APPROVED from 08/28/2023 to 05/05/2098. Ran test claim, Copay is $3.19. This test claim was processed through [facility]- copay amounts may vary at other pharmacies due to pharmacy/plan contracts, or as the patient moves through the different stages of their insurance plan.   PA #/Case ID/Reference #: [id number]

## 2023-09-11 NOTE — Telephone Encounter (Signed)
 Pharmacy Patient Advocate Encounter   Received notification from CoverMyMeds that prior authorization for Temazepam  30MG  capsules is required/requested.   Insurance verification completed.   The patient is insured through [facility] .   Per test claim: PA required; PA submitted to above mentioned insurance via CoverMyMeds Key/confirmation #/EOC [id number] Status is pending

## 2023-09-11 NOTE — Addendum Note (Signed)
 Addended by: Gearldean Keepers on: 09/11/2023 05:06 PM   Modules accepted: Orders
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
## 2023-09-12 ENCOUNTER — Other Ambulatory Visit: Payer: Self-pay | Admitting: Internal Medicine

## 2023-09-26 ENCOUNTER — Ambulatory Visit
Admission: RE | Admit: 2023-09-26 | Discharge: 2023-09-26 | Disposition: A | Discharge status: Home / Self Care | Payer: Worker's Compensation | Source: Ambulatory Visit | Attending: Orthopedic Surgery | Admitting: Orthopedic Surgery

## 2023-09-26 DIAGNOSIS — M4722 Other spondylosis with radiculopathy, cervical region: Secondary | ICD-10-CM

## 2023-10-01 ENCOUNTER — Other Ambulatory Visit: Payer: Self-pay | Admitting: Medical Genetics

## 2023-10-06 ENCOUNTER — Ambulatory Visit: Admitting: Internal Medicine

## 2023-10-06 ENCOUNTER — Ambulatory Visit (INDEPENDENT_AMBULATORY_CARE_PROVIDER_SITE_OTHER): Admitting: Orthopedic Surgery

## 2023-10-06 ENCOUNTER — Ambulatory Visit: Admitting: Orthopedic Surgery

## 2023-10-06 ENCOUNTER — Other Ambulatory Visit: Payer: PRIVATE HEALTH INSURANCE

## 2023-10-06 DIAGNOSIS — M5416 Radiculopathy, lumbar region: Secondary | ICD-10-CM

## 2023-10-06 DIAGNOSIS — M5441 Lumbago with sciatica, right side: Secondary | ICD-10-CM

## 2023-10-06 DIAGNOSIS — M5442 Lumbago with sciatica, left side: Secondary | ICD-10-CM

## 2023-10-06 DIAGNOSIS — Z006 Encounter for examination for normal comparison and control in clinical research program: Secondary | ICD-10-CM

## 2023-10-06 DIAGNOSIS — M4722 Other spondylosis with radiculopathy, cervical region: Secondary | ICD-10-CM

## 2023-10-06 DIAGNOSIS — G8929 Other chronic pain: Secondary | ICD-10-CM

## 2023-10-07 ENCOUNTER — Encounter: Payer: Self-pay | Admitting: Orthopedic Surgery

## 2023-10-07 ENCOUNTER — Other Ambulatory Visit: Payer: Self-pay | Admitting: Internal Medicine

## 2023-10-07 DIAGNOSIS — M5127 Other intervertebral disc displacement, lumbosacral region: Secondary | ICD-10-CM
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
## 2023-10-08 ENCOUNTER — Ambulatory Visit: Payer: Self-pay | Admitting: Internal Medicine

## 2023-10-08 ENCOUNTER — Encounter: Payer: Self-pay | Admitting: Internal Medicine

## 2023-10-08 ENCOUNTER — Other Ambulatory Visit (HOSPITAL_COMMUNITY): Payer: Self-pay

## 2023-10-08 ENCOUNTER — Ambulatory Visit (INDEPENDENT_AMBULATORY_CARE_PROVIDER_SITE_OTHER): Payer: PRIVATE HEALTH INSURANCE | Admitting: Internal Medicine

## 2023-10-08 VITALS — BP 120/80 | HR 60 | Ht 67.5 in | Wt 183.5 lb

## 2023-10-08 DIAGNOSIS — E890 Postprocedural hypothyroidism: Secondary | ICD-10-CM | POA: Diagnosis not present

## 2023-10-08 DIAGNOSIS — M5127 Other intervertebral disc displacement, lumbosacral region: Secondary | ICD-10-CM | POA: Diagnosis not present

## 2023-10-08 DIAGNOSIS — M2550 Pain in unspecified joint: Secondary | ICD-10-CM

## 2023-10-08 DIAGNOSIS — H04123 Dry eye syndrome of bilateral lacrimal glands: Secondary | ICD-10-CM | POA: Diagnosis not present

## 2023-10-08 LAB — LIPID PANEL
Cholesterol: 208 mg/dL — ABNORMAL HIGH (ref 0–200)
HDL: 65 mg/dL (ref >39.00)
LDL Cholesterol: 122 mg/dL — ABNORMAL HIGH (ref 0–99)
NonHDL: 142.98
Total CHOL/HDL Ratio: 3
Triglycerides: 106 mg/dL (ref 0.0–149.0)
VLDL: 21.2 mg/dL (ref 0.0–40.0)

## 2023-10-08 LAB — CBC WITH DIFFERENTIAL/PLATELET
Basophils Absolute: 0 10*3/uL (ref 0.0–0.1)
Basophils Relative: 0.6 % (ref 0.0–3.0)
Eosinophils Absolute: 0.1 10*3/uL (ref 0.0–0.7)
Eosinophils Relative: 2.8 % (ref 0.0–5.0)
HCT: 41 % (ref 36.0–46.0)
Hemoglobin: 13.7 g/dL (ref 12.0–15.0)
Lymphocytes Relative: 26.9 % (ref 12.0–46.0)
Lymphs Abs: 1.3 10*3/uL (ref 0.7–4.0)
MCHC: 33.4 g/dL (ref 30.0–36.0)
MCV: 89.2 fl (ref 78.0–100.0)
Monocytes Absolute: 0.6 10*3/uL (ref 0.1–1.0)
Monocytes Relative: 12.3 % — ABNORMAL HIGH (ref 3.0–12.0)
Neutro Abs: 2.7 10*3/uL (ref 1.4–7.7)
Neutrophils Relative %: 57.4 % (ref 43.0–77.0)
Platelets: 210 10*3/uL (ref 150.0–400.0)
RBC: 4.59 Mil/uL (ref 3.87–5.11)
RDW: 13.3 % (ref 11.5–15.5)
WBC: 4.7 10*3/uL (ref 4.0–10.5)

## 2023-10-08 LAB — COMPREHENSIVE METABOLIC PANEL WITH GFR
ALT: 15 U/L (ref 0–35)
AST: 19 U/L (ref 0–37)
Albumin: 4.6 g/dL (ref 3.5–5.2)
Alkaline Phosphatase: 63 U/L (ref 39–117)
BUN: 21 mg/dL (ref 6–23)
CO2: 31 meq/L (ref 19–32)
Calcium: 9.8 mg/dL (ref 8.4–10.5)
Chloride: 101 meq/L (ref 96–112)
Creatinine, Ser: 0.79 mg/dL (ref 0.40–1.20)
GFR: 75.49 mL/min (ref >60.00)
Glucose, Bld: 103 mg/dL — ABNORMAL HIGH (ref 70–99)
Potassium: 3.6 meq/L (ref 3.5–5.1)
Sodium: 141 meq/L (ref 135–145)
Total Bilirubin: 0.7 mg/dL (ref 0.2–1.2)
Total Protein: 7.4 g/dL (ref 6.0–8.3)

## 2023-10-08 LAB — TSH: TSH: 1.17 u[IU]/mL (ref 0.35–5.50)

## 2023-10-08 LAB — VITAMIN D 25 HYDROXY (VIT D DEFICIENCY, FRACTURES): VITD: 46.5 ng/mL (ref 30.00–100.00)

## 2023-10-08 LAB — VITAMIN B12: Vitamin B-12: 1294 pg/mL — ABNORMAL HIGH (ref 211–911)

## 2023-10-08 MED ORDER — CYCLOSPORINE 0.05 % OP EMUL: 1.0000 [drp] | Freq: Two times a day (BID) | OPHTHALMIC | 2 refills | Status: AC

## 2023-10-08 NOTE — Progress Notes (Signed)
 Established Patient Office Visit     CC/Reason for Visit: Follow-up chronic conditions, prescription refills  HPI: Sharon Anthony is a 73 y.o. female who is coming in today for the above mentioned reasons. Past Medical History is significant for: Hypertension, hyperlipidemia, hypothyroidism, chronic pain syndrome, insomnia, depression/anxiety.  She is requesting labs today.  She also needs a refill of Restasis  that she uses for dry eyes.   Past Medical/Surgical History: Past Medical History:  Diagnosis Date   Allergy    Anemia    Anxiety    Arthritis    Cervicogenic headache 03/05/2019   Concussion with no loss of consciousness 03/05/2019   Depression    Hypertension    Thyroid  disease     Past Surgical History:  Procedure Laterality Date   ABDOMINAL HYSTERECTOMY     APPENDECTOMY     LUMBAR LAMINECTOMY     L3-4 and L4-5 x 2    Social History:  reports that she quit smoking about 27 years ago. Her smoking use included cigarettes. She has never used smokeless tobacco. She reports current alcohol use of about 7.0 standard drinks of alcohol per week. She reports that she does not use drugs.  Allergies: Allergies  Allergen Reactions   Lisinopril     Other reaction(s): Cough   Prednisone     Other reaction(s): Anxious   Sulfa Antibiotics Other (See Comments)    Other reaction(s): Unspecified    Codeine Other (See Comments)    Other reaction(s): Unspecified    Latex Dermatitis    with prolonged contact only with prolonged contact only   Nabumetone Rash   Nickel Dermatitis    reactions to cheek jewelry reactions to cheek jewelry    Family History:  Family History  Problem Relation Age of Onset   Transient ischemic attack Mother    Dementia Mother    Rectal cancer Father    Colon cancer Father    Cancer Father    Colon polyps Brother    Psoriasis Brother    Parkinson's disease Maternal Uncle    Stomach cancer Other    Arthritis Other    High blood  pressure Other        "everybody"   Stomach cancer Other    Neuropathy Neg Hx    Esophageal cancer Neg Hx      Current Outpatient Medications:    amLODipine  (NORVASC ) 5 MG tablet, Take 1 tablet (5 mg total) by mouth daily., Disp: 90 tablet, Rfl: 0   atorvastatin  (LIPITOR) 20 MG tablet, Take 1 tablet (20 mg total) by mouth daily., Disp: 90 tablet, Rfl: 0   buPROPion  (WELLBUTRIN  XL) 150 MG 24 hr tablet, Take 1 tablet (150 mg total) by mouth daily., Disp: 90 tablet, Rfl: 1   buPROPion  (WELLBUTRIN  XL) 300 MG 24 hr tablet, Take 1 tablet (300 mg total) by mouth daily., Disp: 90 tablet, Rfl: 1   furosemide  (LASIX ) 40 MG tablet, Take 1 tablet by mouth once daily, Disp: 90 tablet, Rfl: 0   gabapentin  (NEURONTIN ) 300 MG capsule, TAKE 2 CAPSULES BY MOUTH 3 TIMES A DAY, Disp: 180 capsule, Rfl: 0   levothyroxine  (SYNTHROID ) 137 MCG tablet, Take 1 tablet (137 mcg total) by mouth daily before breakfast., Disp: 90 tablet, Rfl: 0   losartan -hydrochlorothiazide (HYZAAR) 50-12.5 MG tablet, Take 1 tablet by mouth daily., Disp: 90 tablet, Rfl: 1   Lutein 40 MG CAPS, Take by mouth., Disp: , Rfl:    methocarbamol  (ROBAXIN ) 500 MG  tablet, Take 1 tablet (500 mg total) by mouth every 8 (eight) hours as needed for muscle spasms., Disp: 30 tablet, Rfl: 0   NON FORMULARY, daily., Disp: , Rfl:    nystatin  powder, Apply topically 2 (two) times daily., Disp: 60 g, Rfl: 1   Probiotic Product (PROBIOTIC PO), Take by mouth daily., Disp: , Rfl:    sertraline  (ZOLOFT ) 50 MG tablet, Take 1 tablet (50 mg total) by mouth daily., Disp: 90 tablet, Rfl: 1   temazepam  (RESTORIL ) 30 MG capsule, Take 1 capsule (30 mg total) by mouth at bedtime., Disp: 30 capsule, Rfl: 0   traMADol  (ULTRAM ) 50 MG tablet, TAKE 1 TABLET BY MOUTH EVERY 12 (TWELVE) HOURS AS NEEDED FOR MODERATE PAIN OR SEVERE PAIN., Disp: 20 tablet, Rfl: 0   valACYclovir  (VALTREX ) 500 MG tablet, Take 1 tablet (500 mg total) by mouth 2 (two) times daily., Disp: 60 tablet, Rfl:  5   cycloSPORINE  (RESTASIS ) 0.05 % ophthalmic emulsion, Place 1 drop into both eyes 2 (two) times daily., Disp: 60 each, Rfl: 2  Review of Systems:  Negative unless indicated in HPI.   Physical Exam: Vitals:   10/08/23 0804  BP: 120/80  Pulse: 60  SpO2: 97%  Weight: 183 lb 8 oz (83.2 kg)  Height: 5' 7.5" (1.715 m)    Body mass index is 28.32 kg/m.   Physical Exam Vitals reviewed.  Constitutional:      Appearance: Normal appearance.  HENT:     Head: Normocephalic and atraumatic.  Eyes:     Conjunctiva/sclera: Conjunctivae normal.  Cardiovascular:     Rate and Rhythm: Normal rate and regular rhythm.  Pulmonary:     Effort: Pulmonary effort is normal.     Breath sounds: Normal breath sounds.  Skin:    General: Skin is warm and dry.  Neurological:     General: No focal deficit present.     Mental Status: She is alert and oriented to person, place, and time.  Psychiatric:        Mood and Affect: Mood normal.        Behavior: Behavior normal.        Thought Content: Thought content normal.        Judgment: Judgment normal.     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  Dispense: 60 each; Refill: 2  Postoperative hypothyroidism -     TSH; Future  Polyarthralgia -     Vitamin B12; Future -     VITAMIN D  25 Hydroxy (Vit-D Deficiency, Fractures); Future   - Check labs per request, refill Restasis .  Time spent:31 minutes reviewing chart, interviewing and examining patient and formulating plan of care.     Marguerita Shih, MD [facility]

## 2023-10-14 ENCOUNTER — Encounter: Payer: Self-pay | Admitting: Internal Medicine

## 2023-10-14 DIAGNOSIS — G479 Sleep disorder, unspecified: Secondary | ICD-10-CM

## 2023-10-14 LAB — GENECONNECT MOLECULAR SCREEN: Genetic Analysis Overall Interpretation: NEGATIVE

## 2023-10-14 MED ORDER — TEMAZEPAM 30 MG PO CAPS
30.0000 mg | ORAL_CAPSULE | Freq: Every day | ORAL | 0 refills | Status: DC
Start: 2023-10-14 — End: 2023-10-14

## 2023-10-14 MED ORDER — TEMAZEPAM 30 MG PO CAPS: 30.0000 mg | ORAL_CAPSULE | Freq: Every day | ORAL | 0 refills | Status: DC

## 2023-10-14 NOTE — Telephone Encounter (Signed)
 The first prescription was printed in error and shredded.

## 2023-10-23 ENCOUNTER — Encounter: Payer: Self-pay | Admitting: Internal Medicine

## 2023-10-26 ENCOUNTER — Other Ambulatory Visit: Payer: Self-pay | Admitting: Internal Medicine

## 2023-10-28 ENCOUNTER — Encounter: Payer: Self-pay | Admitting: Internal Medicine

## 2023-10-28 MED ORDER — LEVOTHYROXINE SODIUM 137 MCG PO TABS: 137.0000 ug | ORAL_TABLET | Freq: Every day | ORAL | 0 refills | Status: DC

## 2023-10-31 ENCOUNTER — Other Ambulatory Visit: Payer: Self-pay | Admitting: Internal Medicine

## 2023-11-01 ENCOUNTER — Other Ambulatory Visit: Payer: Self-pay | Admitting: Internal Medicine

## 2023-11-02 ENCOUNTER — Other Ambulatory Visit: Payer: Self-pay | Admitting: Internal Medicine

## 2023-11-02 DIAGNOSIS — M5127 Other intervertebral disc displacement, lumbosacral region: Secondary | ICD-10-CM

## 2023-11-03 ENCOUNTER — Other Ambulatory Visit: Payer: Self-pay | Admitting: Internal Medicine

## 2023-11-03 ENCOUNTER — Ambulatory Visit: Admitting: Physical Therapy

## 2023-11-05 ENCOUNTER — Other Ambulatory Visit: Payer: Self-pay | Admitting: Internal Medicine

## 2023-11-05 DIAGNOSIS — M5127 Other intervertebral disc displacement, lumbosacral region: Secondary | ICD-10-CM

## 2023-11-06 ENCOUNTER — Encounter: Payer: Self-pay | Admitting: Rehabilitative and Restorative Service Providers"

## 2023-11-06 ENCOUNTER — Ambulatory Visit: Payer: Worker's Compensation | Admitting: Rehabilitative and Restorative Service Providers"

## 2023-11-06 DIAGNOSIS — M542 Cervicalgia: Secondary | ICD-10-CM | POA: Diagnosis not present

## 2023-11-06 DIAGNOSIS — R293 Abnormal posture: Secondary | ICD-10-CM

## 2023-11-06 DIAGNOSIS — M6281 Muscle weakness (generalized): Secondary | ICD-10-CM
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
## 2023-11-10 ENCOUNTER — Encounter: Payer: Self-pay | Admitting: Internal Medicine

## 2023-11-10 DIAGNOSIS — F339 Major depressive disorder, recurrent, unspecified: Secondary | ICD-10-CM

## 2023-11-11 MED ORDER — BUPROPION HCL ER (XL) 300 MG PO TB24: 300.0000 mg | ORAL_TABLET | Freq: Every day | ORAL | 1 refills | Status: DC

## 2023-11-13 ENCOUNTER — Other Ambulatory Visit: Payer: Self-pay | Admitting: Physician Assistant

## 2023-11-13 DIAGNOSIS — S060X0D Concussion without loss of consciousness, subsequent encounter: Secondary | ICD-10-CM

## 2023-11-13 DIAGNOSIS — M5416 Radiculopathy, lumbar region: Secondary | ICD-10-CM

## 2023-11-13 DIAGNOSIS — R519 Headache, unspecified: Secondary | ICD-10-CM

## 2023-11-13 DIAGNOSIS — M5412 Radiculopathy, cervical region: Secondary | ICD-10-CM

## 2023-11-18 ENCOUNTER — Encounter: Payer: Self-pay | Admitting: Internal Medicine

## 2023-11-18 DIAGNOSIS — G479 Sleep disorder, unspecified: Secondary | ICD-10-CM

## 2023-11-18 MED ORDER — TEMAZEPAM 30 MG PO CAPS: 30.0000 mg | ORAL_CAPSULE | Freq: Every day | ORAL | 0 refills | Status: DC

## 2023-11-18 MED ORDER — TEMAZEPAM 30 MG PO CAPS
30.0000 mg | ORAL_CAPSULE | Freq: Every day | ORAL | 0 refills | Status: DC
Start: 2023-11-18 — End: 2023-12-17

## 2023-11-25 ENCOUNTER — Encounter: Payer: Self-pay | Admitting: Internal Medicine

## 2023-11-25 DIAGNOSIS — F339 Major depressive disorder, recurrent, unspecified: Secondary | ICD-10-CM

## 2023-11-25 MED ORDER — SERTRALINE HCL 50 MG PO TABS
50.0000 mg | ORAL_TABLET | Freq: Every day | ORAL | 1 refills | Status: DC
Start: 2023-11-25 — End: 2023-11-26

## 2023-11-26 ENCOUNTER — Other Ambulatory Visit: Payer: Self-pay | Admitting: Internal Medicine

## 2023-11-26 DIAGNOSIS — F339 Major depressive disorder, recurrent, unspecified: Secondary | ICD-10-CM

## 2023-11-30 ENCOUNTER — Other Ambulatory Visit: Payer: Self-pay | Admitting: Internal Medicine

## 2023-11-30 DIAGNOSIS — M5127 Other intervertebral disc displacement, lumbosacral region: Secondary | ICD-10-CM

## 2023-12-17 ENCOUNTER — Other Ambulatory Visit: Payer: Self-pay | Admitting: Internal Medicine

## 2023-12-17 MED ORDER — TEMAZEPAM 30 MG PO CAPS: 30.0000 mg | ORAL_CAPSULE | Freq: Every day | ORAL | 0 refills | Status: DC

## 2023-12-17 NOTE — Addendum Note (Signed)
 Addended by: EVELINE LAURAINE BRAVO on: 12/17/2023 10:16 AM   Modules accepted: Orders

## 2023-12-19 ENCOUNTER — Ambulatory Visit
Admission: RE | Admit: 2023-12-19 | Discharge: 2023-12-19 | Disposition: A | Discharge status: Home / Self Care | Payer: Worker's Compensation | Source: Ambulatory Visit | Attending: Physician Assistant | Admitting: Physician Assistant

## 2023-12-19 ENCOUNTER — Other Ambulatory Visit: Payer: PRIVATE HEALTH INSURANCE

## 2023-12-19 DIAGNOSIS — R519 Headache, unspecified: Secondary | ICD-10-CM

## 2023-12-19 DIAGNOSIS — M5412 Radiculopathy, cervical region: Secondary | ICD-10-CM

## 2023-12-19 DIAGNOSIS — S060X0D Concussion without loss of consciousness, subsequent encounter: Secondary | ICD-10-CM

## 2023-12-19 DIAGNOSIS — M5416 Radiculopathy, lumbar region: Secondary | ICD-10-CM

## 2023-12-24 ENCOUNTER — Encounter: Payer: Self-pay | Admitting: Obstetrics

## 2023-12-24 LAB — HM MAMMOGRAPHY

## 2023-12-26 ENCOUNTER — Other Ambulatory Visit: Payer: PRIVATE HEALTH INSURANCE

## 2023-12-31 ENCOUNTER — Other Ambulatory Visit: Payer: Self-pay | Admitting: Internal Medicine

## 2023-12-31 ENCOUNTER — Other Ambulatory Visit: Payer: PRIVATE HEALTH INSURANCE

## 2023-12-31 DIAGNOSIS — M5127 Other intervertebral disc displacement, lumbosacral region: Secondary | ICD-10-CM

## 2024-01-01 ENCOUNTER — Encounter: Payer: Self-pay | Admitting: Internal Medicine

## 2024-01-01 DIAGNOSIS — R768 Other specified abnormal immunological findings in serum: Secondary | ICD-10-CM

## 2024-01-01 MED ORDER — VALACYCLOVIR HCL 500 MG PO TABS: 500.0000 mg | ORAL_TABLET | Freq: Two times a day (BID) | ORAL | 5 refills | Status: AC

## 2024-01-06 ENCOUNTER — Ambulatory Visit
Admission: RE | Admit: 2024-01-06 | Discharge: 2024-01-06 | Disposition: A | Discharge status: Home / Self Care | Payer: Worker's Compensation | Source: Ambulatory Visit | Attending: Physician Assistant | Admitting: Physician Assistant

## 2024-01-06 DIAGNOSIS — M5416 Radiculopathy, lumbar region: Secondary | ICD-10-CM

## 2024-01-12 ENCOUNTER — Encounter: Payer: Self-pay | Admitting: Internal Medicine

## 2024-01-12 DIAGNOSIS — D229 Melanocytic nevi, unspecified: Secondary | ICD-10-CM

## 2024-01-15 ENCOUNTER — Encounter: Payer: Self-pay | Admitting: Internal Medicine

## 2024-01-15 DIAGNOSIS — G479 Sleep disorder, unspecified: Secondary | ICD-10-CM

## 2024-01-19 ENCOUNTER — Other Ambulatory Visit: Payer: Self-pay | Admitting: Internal Medicine

## 2024-01-19 DIAGNOSIS — G479 Sleep disorder, unspecified: Secondary | ICD-10-CM

## 2024-01-19 MED ORDER — TEMAZEPAM 30 MG PO CAPS: 30.0000 mg | ORAL_CAPSULE | Freq: Every day | ORAL | 0 refills | Status: DC

## 2024-01-28 ENCOUNTER — Other Ambulatory Visit: Payer: Self-pay | Admitting: Internal Medicine

## 2024-01-28 DIAGNOSIS — M5127 Other intervertebral disc displacement, lumbosacral region: Secondary | ICD-10-CM

## 2024-02-17 ENCOUNTER — Other Ambulatory Visit: Payer: Self-pay | Admitting: Internal Medicine

## 2024-02-17 DIAGNOSIS — G479 Sleep disorder, unspecified: Secondary | ICD-10-CM

## 2024-02-17 MED ORDER — TEMAZEPAM 30 MG PO CAPS: 30.0000 mg | ORAL_CAPSULE | Freq: Every day | ORAL | 2 refills | Status: DC

## 2024-02-20 ENCOUNTER — Other Ambulatory Visit: Payer: Self-pay | Admitting: Internal Medicine

## 2024-02-20 DIAGNOSIS — F339 Major depressive disorder, recurrent, unspecified: Secondary | ICD-10-CM

## 2024-02-20 DIAGNOSIS — M5127 Other intervertebral disc displacement, lumbosacral region: Secondary | ICD-10-CM

## 2024-03-08 ENCOUNTER — Encounter: Payer: Self-pay | Admitting: Radiology

## 2024-03-11 ENCOUNTER — Other Ambulatory Visit: Payer: Self-pay | Admitting: Physical Medicine and Rehabilitation

## 2024-03-11 DIAGNOSIS — M961 Postlaminectomy syndrome, not elsewhere classified: Secondary | ICD-10-CM

## 2024-03-11 DIAGNOSIS — M5416 Radiculopathy, lumbar region: Secondary | ICD-10-CM

## 2024-03-13 ENCOUNTER — Other Ambulatory Visit: Payer: Self-pay | Admitting: Internal Medicine

## 2024-03-24 ENCOUNTER — Other Ambulatory Visit: Payer: Self-pay | Admitting: Internal Medicine

## 2024-03-24 DIAGNOSIS — M5127 Other intervertebral disc displacement, lumbosacral region: Secondary | ICD-10-CM

## 2024-04-14 ENCOUNTER — Ambulatory Visit (INDEPENDENT_AMBULATORY_CARE_PROVIDER_SITE_OTHER): Payer: Worker's Compensation | Admitting: Physical Medicine and Rehabilitation

## 2024-04-14 ENCOUNTER — Other Ambulatory Visit: Payer: Self-pay

## 2024-04-14 VITALS — BP 133/74 | HR 70

## 2024-04-14 DIAGNOSIS — M5416 Radiculopathy, lumbar region: Secondary | ICD-10-CM

## 2024-04-14 MED ORDER — METHYLPREDNISOLONE ACETATE 40 MG/ML IJ SUSP
40.0000 mg | Freq: Once | INTRAMUSCULAR | Status: AC
  Administered 2024-04-14: 40 mg

## 2024-04-14 NOTE — Progress Notes (Unsigned)
 Pain Scale   Average Pain 6 Patient advising she has lower back pain that is constant without relief.        +Driver, -BT, -Dye Allergies.

## 2024-04-21 ENCOUNTER — Other Ambulatory Visit: Payer: Self-pay | Admitting: Internal Medicine

## 2024-04-23 ENCOUNTER — Other Ambulatory Visit: Payer: Self-pay | Admitting: Internal Medicine

## 2024-04-26 NOTE — Procedures (Signed)
 Lumbosacral Transforaminal Epidural Steroid Injection - Sub-Pedicular Approach with Fluoroscopic Guidance  [name]      Date of Birth: 1950-05-11 MRN: [id number] PCP: Theophilus Andrews, Tully GRADE, MD      Visit Date: 04/14/2024   Universal Protocol:    Date/Time: 04/14/2024  Consent Given By: the patient  Position: PRONE  Additional Comments: Vital signs were monitored before and after the procedure. Patient was prepped and draped in the usual sterile fashion. The correct patient, procedure, and site was verified.   Injection Procedure Details:   Procedure diagnoses: Lumbar radiculopathy [M54.16]    Meds Administered:  Meds ordered this encounter  Medications   methylPREDNISolone  acetate (DEPO-MEDROL ) injection 40 mg    Laterality: Bilateral  Location/Site: L5  Needle:5.0 in., 22 ga.  Short bevel or Quincke spinal needle  Needle Placement: Transforaminal  Findings:    -Comments: Excellent flow of contrast along the nerve, nerve root and into the epidural space.  Procedure Details: After squaring off the end-plates to get a true AP view, the C-arm was positioned so that an oblique view of the foramen as noted above was visualized. The target area is just inferior to the nose of the scotty dog or sub pedicular. The soft tissues overlying this structure were infiltrated with 2-3 ml. of 1% Lidocaine  without Epinephrine.  The spinal needle was inserted toward the target using a trajectory view along the fluoroscope beam.  Under AP and lateral visualization, the needle was advanced so it did not puncture dura and was located close the 6 O'Clock position of the pedical in AP tracterory. Biplanar projections were used to confirm position. Aspiration was confirmed to be negative for CSF and/or blood. A 1-2 ml. volume of Isovue-250 was injected and flow of contrast was noted at each level. Radiographs were obtained for documentation purposes.   After attaining the  desired flow of contrast documented above, a 0.5 to 1.0 ml test dose of 0.25% Marcaine  was injected into each respective transforaminal space.  The patient was observed for 90 seconds post injection.  After no sensory deficits were reported, and normal lower extremity motor function was noted,   the above injectate was administered so that equal amounts of the injectate were placed at each foramen (level) into the transforaminal epidural space.   Additional Comments:  The patient tolerated the procedure well Dressing: 2 x 2 sterile gauze and Band-Aid    Post-procedure details: Patient was observed during the procedure. Post-procedure instructions were reviewed.  Patient left the clinic in stable condition.

## 2024-04-26 NOTE — Progress Notes (Signed)
 "  Sharon Anthony - 73 y.o. female MRN [id number]  Date of birth: November 29, 1950  Office Visit Note: Visit Date: 04/14/2024 PCP: Theophilus Andrews, Tully GRADE, MD Referred by: Theophilus Andrews, Tully GRADE, MD  Subjective: Chief Complaint  Patient presents with   Lower Back - Pain   HPI:  Sharon Anthony is a 73 y.o. female who comes in today at the request of Duwaine Pouch, FNP for planned Bilateral L5-S1 Lumbar Transforaminal epidural steroid injection with fluoroscopic guidance.  The patient has failed conservative care including home exercise, medications, time and activity modification.  This injection will be diagnostic and hopefully therapeutic.  Please see requesting physician notes for further details and justification.   ROS Otherwise per HPI.  Assessment & Plan: Visit Diagnoses:    ICD-10-CM   1. Lumbar radiculopathy  M54.16 XR C-ARM NO REPORT    Epidural Steroid injection    methylPREDNISolone  acetate (DEPO-MEDROL ) injection 40 mg      Plan: No additional findings.   Meds & Orders:  Meds ordered this encounter  Medications   methylPREDNISolone  acetate (DEPO-MEDROL ) injection 40 mg    Orders Placed This Encounter  Procedures   XR C-ARM NO REPORT   Epidural Steroid injection    Follow-up: Return for visit to requesting provider as needed.   Procedures: No procedures performed  Lumbosacral Transforaminal Epidural Steroid Injection - Sub-Pedicular Approach with Fluoroscopic Guidance  [name]      Date of Birth: Sep 28, 1950 MRN: [id number] PCP: Theophilus Andrews, Tully GRADE, MD      Visit Date: 04/14/2024   Universal Protocol:    Date/Time: 04/14/2024  Consent Given By: the patient  Position: PRONE  Additional Comments: Vital signs were monitored before and after the procedure. Patient was prepped and draped in the usual sterile fashion. The correct patient, procedure, and site was verified.   Injection Procedure Details:   Procedure diagnoses:  Lumbar radiculopathy [M54.16]    Meds Administered:  Meds ordered this encounter  Medications   methylPREDNISolone  acetate (DEPO-MEDROL ) injection 40 mg    Laterality: Bilateral  Location/Site: L5  Needle:5.0 in., 22 ga.  Short bevel or Quincke spinal needle  Needle Placement: Transforaminal  Findings:    -Comments: Excellent flow of contrast along the nerve, nerve root and into the epidural space.  Procedure Details: After squaring off the end-plates to get a true AP view, the C-arm was positioned so that an oblique view of the foramen as noted above was visualized. The target area is just inferior to the nose of the scotty dog or sub pedicular. The soft tissues overlying this structure were infiltrated with 2-3 ml. of 1% Lidocaine  without Epinephrine.  The spinal needle was inserted toward the target using a trajectory view along the fluoroscope beam.  Under AP and lateral visualization, the needle was advanced so it did not puncture dura and was located close the 6 O'Clock position of the pedical in AP tracterory. Biplanar projections were used to confirm position. Aspiration was confirmed to be negative for CSF and/or blood. A 1-2 ml. volume of Isovue-250 was injected and flow of contrast was noted at each level. Radiographs were obtained for documentation purposes.   After attaining the desired flow of contrast documented above, a 0.5 to 1.0 ml test dose of 0.25% Marcaine  was injected into each respective transforaminal space.  The patient was observed for 90 seconds post injection.  After no sensory deficits were reported, and normal lower extremity motor function was noted,  the above injectate was administered so that equal amounts of the injectate were placed at each foramen (level) into the transforaminal epidural space.   Additional Comments:  The patient tolerated the procedure well Dressing: 2 x 2 sterile gauze and Band-Aid    Post-procedure details: Patient was  observed during the procedure. Post-procedure instructions were reviewed.  Patient left the clinic in stable condition.    Clinical History: MRI LUMBAR SPINE 01/06/2024 08:34:39 AM   TECHNIQUE: Multiplanar multisequence MRI of the lumbar spine was performed without the administration of intravenous contrast.   COMPARISON: None available.   CLINICAL HISTORY: Radiculopathy, lumbar region.   FINDINGS:   BONES AND ALIGNMENT: Grade 1 degenerative anterolisthesis at L4-5 is stable. Slight retrolisthesis at L3-4 is stable. Mild rightward curvature is centered at L3. Mild edematous endplate changes are noted along the superior endplate of T12, stable. Schmorl's nodes are present across multiple levels.   SPINAL CORD: Conus medullaris terminates at L1-2.   SOFT TISSUES: No paraspinal mass.   L1-L2: Mild disc bulging and facet hypertrophy is present without significant stenosis.   L2-L3: Partial laminectomy is present. Mild left subarticular narrowing is stable. Moderate left and mild right foraminal stenosis is stable.   L3-L4: Wide laminectomy decompresses the posterior canal. A broad-based disc protrusion remains. Moderate foraminal narrowing bilaterally is stable.   L4-L5: Uncovertebral broad-based disc protrusion is present. Wide laminectomy decompresses the spinal canal. Severe right and moderate left foraminal stenosis is stable. Type 2 motor changes are present.   L5-S1: A chronic broad-based disc protrusion is present. Laminectomy decompresses the posterior canal. Moderate foraminal stenosis bilaterally is stable. Type 2 motor changes are present.   IMPRESSION: 1. Stable grade 1 degenerative anterolisthesis at L4-5 and slight retrolisthesis at L3-4. 2. Stable moderate left and mild right foraminal stenosis at L2-L3. 3. Stable moderate foraminal narrowing bilaterally at L3-L4. 4. Stable severe right and moderate left foraminal stenosis at L4-L5. 5. Stable  moderate foraminal stenosis bilaterally at L5-S1.   Electronically signed by: Lonni Necessary MD 01/14/2024 04:36     Objective:  VS:  HT:    WT:   BMI:     BP:133/74  HR:70bpm  TEMP: ( )  RESP:  Physical Exam Vitals and nursing note reviewed.  Constitutional:      General: She is not in acute distress.    Appearance: Normal appearance. She is not ill-appearing.  HENT:     Head: Normocephalic and atraumatic.     Right Ear: External ear normal.     Left Ear: External ear normal.  Eyes:     Extraocular Movements: Extraocular movements intact.  Cardiovascular:     Rate and Rhythm: Normal rate.     Pulses: Normal pulses.  Pulmonary:     Effort: Pulmonary effort is normal. No respiratory distress.  Abdominal:     General: There is no distension.     Palpations: Abdomen is soft.  Musculoskeletal:        General: Tenderness present.     Cervical back: Neck supple.     Right lower leg: No edema.     Left lower leg: No edema.     Comments: Patient has good distal strength with no pain over the greater trochanters.  No clonus or focal weakness.  Skin:    Findings: No erythema, lesion or rash.  Neurological:     General: No focal deficit present.     Mental Status: She is alert and oriented to person, place, and time.  Sensory: No sensory deficit.     Motor: No weakness or abnormal muscle tone.     Coordination: Coordination normal.  Psychiatric:        Mood and Affect: Mood normal.        Behavior: Behavior normal.      Imaging: No results found. "

## 2024-04-28 ENCOUNTER — Other Ambulatory Visit: Payer: Self-pay | Admitting: Internal Medicine

## 2024-05-03 ENCOUNTER — Ambulatory Visit: Payer: PRIVATE HEALTH INSURANCE | Admitting: Physician Assistant

## 2024-05-08 ENCOUNTER — Other Ambulatory Visit: Payer: Self-pay | Admitting: Internal Medicine

## 2024-05-08 DIAGNOSIS — F339 Major depressive disorder, recurrent, unspecified: Secondary | ICD-10-CM

## 2024-05-14 ENCOUNTER — Ambulatory Visit: Payer: PRIVATE HEALTH INSURANCE

## 2024-05-21 ENCOUNTER — Other Ambulatory Visit: Payer: Self-pay | Admitting: Internal Medicine

## 2024-05-22 ENCOUNTER — Other Ambulatory Visit: Payer: Self-pay | Admitting: Internal Medicine

## 2024-05-22 DIAGNOSIS — F339 Major depressive disorder, recurrent, unspecified: Secondary | ICD-10-CM

## 2024-06-09 ENCOUNTER — Encounter: Payer: Self-pay | Admitting: Internal Medicine

## 2024-06-09 DIAGNOSIS — G479 Sleep disorder, unspecified: Secondary | ICD-10-CM

## 2024-06-09 MED ORDER — TEMAZEPAM 30 MG PO CAPS: 30.0000 mg | ORAL_CAPSULE | Freq: Every day | ORAL | 2 refills | Status: AC
